# Patient Record
Sex: Female | Born: 1980 | Race: Black or African American | Hispanic: No | Marital: Single | Smoking: Never smoker
Health system: Southern US, Community
[De-identification: ages and names within clinical notes are randomized; demographics above are authoritative.]

## PROBLEM LIST (undated history)

## (undated) ENCOUNTER — Inpatient Hospital Stay (HOSPITAL_COMMUNITY): Payer: Self-pay

## (undated) DIAGNOSIS — D649 Anemia, unspecified: Secondary | ICD-10-CM

## (undated) HISTORY — PX: DILATION AND CURETTAGE OF UTERUS: SHX78

## (undated) HISTORY — DX: Anemia, unspecified: D64.9

---

## 2015-01-23 ENCOUNTER — Encounter (HOSPITAL_COMMUNITY): Payer: Self-pay | Admitting: Emergency Medicine

## 2015-01-23 ENCOUNTER — Emergency Department (HOSPITAL_COMMUNITY)
Admission: EM | Admit: 2015-01-23 | Discharge: 2015-01-23 | Disposition: A | Discharge status: Home / Self Care | Payer: Medicaid - Out of State | Attending: Emergency Medicine | Admitting: Emergency Medicine

## 2015-01-23 DIAGNOSIS — Z30432 Encounter for removal of intrauterine contraceptive device: Secondary | ICD-10-CM | POA: Diagnosis not present

## 2015-01-23 DIAGNOSIS — Z3046 Encounter for surveillance of implantable subdermal contraceptive: Secondary | ICD-10-CM

## 2015-01-23 MED ORDER — LIDOCAINE HCL (PF) 1 % IJ SOLN
5.0000 mL | Freq: Once | INTRAMUSCULAR | Status: DC
  Filled 2015-01-23: qty 5

## 2015-01-23 NOTE — ED Notes (Signed)
Pt sts here to have Nexplanon removed

## 2015-01-23 NOTE — Discharge Instructions (Signed)
Please monitor for signs of infection, pain present please return immediately. Please follow-up with [facility] and wellness for further evaluation and management of your health needs.

## 2015-01-23 NOTE — ED Provider Notes (Signed)
CSN: [id number]     Arrival date & time 01/23/15  1319 History  This chart was scribed for non-physician practitioner, Eyvonne Mechanic, PA-C, working with Derwood Kaplan, MD, by Ronney Lion, ED Scribe. This patient was seen in room TR04C/TR04C and the patient's care was started at 2:30 PM.    Chief Complaint  Patient presents with  . remove Nexplanon    The history is provided by the patient. No language interpreter was used.   HPI Comments: Erica Bryant is a 34 y.o. female who presents to the Emergency Department to have her Nexplanon removed because it is expired. Patient states her doctor in [place] put this in 3 years ago. However, she just moved to [facility] and does not have a PCP. She reports she is normally healthy, with no chronic medical conditions.  Patient has no other complaints.  History reviewed. No pertinent past medical history. History reviewed. No pertinent past surgical history. History reviewed. No pertinent family history. Social History  Substance Use Topics  . Smoking status: Never Smoker   . Smokeless tobacco: None  . Alcohol Use: Yes   OB History    No data available     Review of Systems  All other systems reviewed and are negative.   Allergies  Review of patient's allergies indicates no known allergies.  Home Medications   Prior to Admission medications   Not on File   BP 120/71 mmHg  Pulse 70  Temp(Src) 98.3 F (36.8 C) (Oral)  Resp 18  SpO2 99%   Physical Exam  Constitutional: She is oriented to person, place, and time. She appears well-developed and well-nourished. No distress.  HENT:  Head: Normocephalic and atraumatic.  Eyes: Conjunctivae and EOM are normal.  Neck: Neck supple. No tracheal deviation present.  Cardiovascular: Normal rate.   Pulmonary/Chest: Effort normal. No respiratory distress.  Musculoskeletal: Normal range of motion.  Nexplanon left upper arm  Neurological: She is alert and oriented to person, place,  and time.  Skin: Skin is warm and dry.  Psychiatric: She has a normal mood and affect. Her behavior is normal.  Nursing note and vitals reviewed.   ED Course  Procedures (including critical care time)  Nexplanon removal performed by myself. Risks and benefits were discussed with patient, who agreed and understood risks of removal. 0.25 cm incision was made after area was prepped using Betadine, And was removed with very minimal amount of bleeding, pressure applied bleeding controlled, Steri-Strips placed over the small incision site. Entirety of Nexplanon was visualized, patient tolerated the procedure well without complication. Local anesthesia achieved with 1% lidocaine, 25-gauge needle, 1 mL of lidocaine total.   Labs Review Labs Reviewed - No data to display  Imaging Review No results found. I have personally reviewed and evaluated these images and lab results as part of my medical decision-making.   EKG Interpretation None      MDM   Final diagnoses:  Nexplanon removal    Labs:   Imaging:  Consults:  Therapeutics: Lidocaine  Discharge Meds:   Assessment/Plan: Patient presents for removal of Nexplanon. No signs of infection, easily removed with minimal blood loss. No bleeding at the time of discharge, patient given strict return precautions, encouraged to monitor for signs of infection, return immediately. She was given follow-up information for [facility] wellness in the event new signs or symptoms presented. Patient is encouraged to use birth control at this time.   I personally performed the services described in this documentation, which  was scribed in my presence. The recorded information has been reviewed and is accurate.     Eyvonne Mechanic, PA-C 01/23/15 1640  Derwood Kaplan, MD 01/23/15 1651

## 2015-01-23 NOTE — ED Notes (Signed)
PT called X2 no answer

## 2015-05-06 NOTE — L&D Delivery Note (Signed)
Patient is 35 y.o. [id number] 1169w3d admitted SOL   Delivery Note At 1:41 AM a viable female was delivered via Vaginal, Spontaneous Delivery (Presentation: ROA ).  APGAR: 9, 9 ; weight pending. Placenta status: intact, .  Cord: 3 vessel  Without complications   Anesthesia:  epidural Episiotomy: None Lacerations: 1st degree perineal Suture Repair: 3.0 vicryl Est. Blood Loss (mL):  100  Mom to postpartum.  Baby to Couplet care / Skin to Skin.  Leland HerElsia J Yoo 01/06/2016, 2:17 AM   Upon arrival patient was complete and pushing. She pushed with good maternal effort to deliver a healthy baby girl. Baby delivered without difficulty, was noted to have good tone and place on maternal abdomen for oral suctioning, drying and stimulation. Delayed cord clamping performed. Placenta delivered intact with 3V cord. Vaginal canal and perineum was inspected and repaired; hemostatic. Pitocin was started and uterus massaged until bleeding slowed. Counts of sharps, instruments, and lap pads were all correct.   Leland HerElsia J Yoo, DO PGY-1 9/3/20172:17 AM    OB FELLOW DELIVERY ATTESTATION  I was gloved and present for the delivery in its entirety, and I agree with the above resident's note.    Jen MowElizabeth Rosine Solecki, DO OB Fellow 5:03 AM

## 2015-05-31 ENCOUNTER — Ambulatory Visit: Payer: Self-pay

## 2015-05-31 ENCOUNTER — Encounter: Payer: Self-pay | Admitting: Obstetrics & Gynecology

## 2015-06-01 LAB — POCT PREGNANCY, URINE: PREG TEST UR: POSITIVE — AB

## 2015-06-25 ENCOUNTER — Other Ambulatory Visit (HOSPITAL_COMMUNITY)
Admission: RE | Admit: 2015-06-25 | Discharge: 2015-06-25 | Disposition: A | Discharge status: Home / Self Care | Payer: Medicaid Other | Source: Ambulatory Visit | Attending: Family Medicine | Admitting: Family Medicine

## 2015-06-25 ENCOUNTER — Ambulatory Visit (INDEPENDENT_AMBULATORY_CARE_PROVIDER_SITE_OTHER): Payer: Medicaid Other | Admitting: Family Medicine

## 2015-06-25 ENCOUNTER — Encounter: Payer: Self-pay | Admitting: Family Medicine

## 2015-06-25 VITALS — BP 116/77 | HR 88 | Temp 98.7°F | Ht 66.0 in | Wt 158.0 lb

## 2015-06-25 DIAGNOSIS — Z1151 Encounter for screening for human papillomavirus (HPV): Secondary | ICD-10-CM | POA: Insufficient documentation

## 2015-06-25 DIAGNOSIS — Z3481 Encounter for supervision of other normal pregnancy, first trimester: Secondary | ICD-10-CM

## 2015-06-25 DIAGNOSIS — Z124 Encounter for screening for malignant neoplasm of cervix: Secondary | ICD-10-CM

## 2015-06-25 DIAGNOSIS — Z01419 Encounter for gynecological examination (general) (routine) without abnormal findings: Secondary | ICD-10-CM | POA: Diagnosis present

## 2015-06-25 DIAGNOSIS — Z3490 Encounter for supervision of normal pregnancy, unspecified, unspecified trimester: Secondary | ICD-10-CM | POA: Insufficient documentation

## 2015-06-25 DIAGNOSIS — Z113 Encounter for screening for infections with a predominantly sexual mode of transmission: Secondary | ICD-10-CM | POA: Diagnosis present

## 2015-06-25 DIAGNOSIS — Z3491 Encounter for supervision of normal pregnancy, unspecified, first trimester: Secondary | ICD-10-CM

## 2015-06-25 LAB — POCT URINALYSIS DIP (DEVICE)
Bilirubin Urine: NEGATIVE
GLUCOSE, UA: NEGATIVE mg/dL
KETONES UR: NEGATIVE mg/dL
LEUKOCYTES UA: NEGATIVE
Nitrite: NEGATIVE
Protein, ur: NEGATIVE mg/dL
Specific Gravity, Urine: 1.02 (ref 1.005–1.030)
Urobilinogen, UA: 0.2 mg/dL (ref 0.0–1.0)
pH: 6 (ref 5.0–8.0)

## 2015-06-25 NOTE — Patient Instructions (Addendum)
Safe Medications in Pregnancy   Acne: Benzoyl Peroxide Salicylic Acid  Backache/Headache: Tylenol: 2 regular strength every 4 hours OR              2 Extra strength every 6 hours  Colds/Coughs/Allergies: Benadryl (alcohol free) 25 mg every 6 hours as needed Breath right strips Claritin Cepacol throat lozenges Chloraseptic throat spray Cold-Eeze- up to three times per day Cough drops, alcohol free Flonase (by prescription only) Guaifenesin Mucinex Robitussin DM (plain only, alcohol free) Saline nasal spray/drops Sudafed (pseudoephedrine) & Actifed ** use only after [redacted] weeks gestation and if you do not have high blood pressure Tylenol Vicks Vaporub Zinc lozenges Zyrtec   Constipation: Colace Ducolax suppositories Fleet enema Glycerin suppositories Metamucil Milk of magnesia Miralax Senokot Smooth move tea  Diarrhea: Kaopectate Imodium A-D  *NO pepto Bismol  Hemorrhoids: Anusol Anusol HC Preparation H Tucks  Indigestion: Tums Maalox Mylanta Zantac  Pepcid  Insomnia: Benadryl (alcohol free) 25mg every 6 hours as needed Tylenol PM Unisom, no Gelcaps  Leg Cramps: Tums MagGel  Nausea/Vomiting:  Bonine Dramamine Emetrol Ginger extract Sea bands Meclizine  Nausea medication to take during pregnancy:  Unisom (doxylamine succinate 25 mg tablets) Take one tablet daily at bedtime. If symptoms are not adequately controlled, the dose can be increased to a maximum recommended dose of two tablets daily (1/2 tablet in the morning, 1/2 tablet mid-afternoon and one at bedtime). Vitamin B6 100mg tablets. Take one tablet twice a day (up to 200 mg per day).  Skin Rashes: Aveeno products Benadryl cream or 25mg every 6 hours as needed Calamine Lotion 1% cortisone cream  Yeast infection: Gyne-lotrimin 7 Monistat 7   **If taking multiple medications, please check labels to avoid duplicating the same active ingredients **take medication as directed on  the label ** Do not exceed 4000 mg of tylenol in 24 hours **Do not take medications that contain aspirin or ibuprofen    First Trimester of Pregnancy The first trimester of pregnancy is from week 1 until the end of week 12 (months 1 through 3). A week after a sperm fertilizes an egg, the egg will implant on the wall of the uterus. This embryo will begin to develop into a baby. Genes from you and your partner are forming the baby. The female genes determine whether the baby is a boy or a girl. At 6-8 weeks, the eyes and face are formed, and the heartbeat can be seen on ultrasound. At the end of 12 weeks, all the baby's organs are formed.  Now that you are pregnant, you will want to do everything you can to have a healthy baby. Two of the most important things are to get good prenatal care and to follow your health care provider's instructions. Prenatal care is all the medical care you receive before the baby's birth. This care will help prevent, find, and treat any problems during the pregnancy and childbirth. BODY CHANGES Your body goes through many changes during pregnancy. The changes vary from woman to woman.   You may gain or lose a couple of pounds at first.  You may feel sick to your stomach (nauseous) and throw up (vomit). If the vomiting is uncontrollable, call your health care provider.  You may tire easily.  You may develop headaches that can be relieved by medicines approved by your health care provider.  You may urinate more often. Painful urination may mean you have a bladder infection.  You may develop heartburn as a result of your   pregnancy.  You may develop constipation because certain hormones are causing the muscles that push waste through your intestines to slow down.  You may develop hemorrhoids or swollen, bulging veins (varicose veins).  Your breasts may begin to grow larger and become tender. Your nipples may stick out more, and the tissue that surrounds them (areola)  may become darker.  Your gums may bleed and may be sensitive to brushing and flossing.  Dark spots or blotches (chloasma, mask of pregnancy) may develop on your face. This will likely fade after the baby is born.  Your menstrual periods will stop.  You may have a loss of appetite.  You may develop cravings for certain kinds of food.  You may have changes in your emotions from day to day, such as being excited to be pregnant or being concerned that something may go wrong with the pregnancy and baby.  You may have more vivid and strange dreams.  You may have changes in your hair. These can include thickening of your hair, rapid growth, and changes in texture. Some women also have hair loss during or after pregnancy, or hair that feels dry or thin. Your hair will most likely return to normal after your baby is born. WHAT TO EXPECT AT YOUR PRENATAL VISITS During a routine prenatal visit:  You will be weighed to make sure you and the baby are growing normally.  Your blood pressure will be taken.  Your abdomen will be measured to track your baby's growth.  The fetal heartbeat will be listened to starting around week 10 or 12 of your pregnancy.  Test results from any previous visits will be discussed. Your health care provider may ask you:  How you are feeling.  If you are feeling the baby move.  If you have had any abnormal symptoms, such as leaking fluid, bleeding, severe headaches, or abdominal cramping.  If you are using any tobacco products, including cigarettes, chewing tobacco, and electronic cigarettes.  If you have any questions. Other tests that may be performed during your first trimester include:  Blood tests to find your blood type and to check for the presence of any previous infections. They will also be used to check for low iron levels (anemia) and Rh antibodies. Later in the pregnancy, blood tests for diabetes will be done along with other tests if problems  develop.  Urine tests to check for infections, diabetes, or protein in the urine.  An ultrasound to confirm the proper growth and development of the baby.  An amniocentesis to check for possible genetic problems.  Fetal screens for spina bifida and Down syndrome.  You may need other tests to make sure you and the baby are doing well.  HIV (human immunodeficiency virus) testing. Routine prenatal testing includes screening for HIV, unless you choose not to have this test. HOME CARE INSTRUCTIONS  Medicines  Follow your health care provider's instructions regarding medicine use. Specific medicines may be either safe or unsafe to take during pregnancy.  Take your prenatal vitamins as directed.  If you develop constipation, try taking a stool softener if your health care provider approves. Diet  Eat regular, well-balanced meals. Choose a variety of foods, such as meat or vegetable-based protein, fish, milk and low-fat dairy products, vegetables, fruits, and whole grain breads and cereals. Your health care provider will help you determine the amount of weight gain that is right for you.  Avoid raw meat and uncooked cheese. These carry germs that can cause   birth defects in the baby.  Eating four or five small meals rather than three large meals a day may help relieve nausea and vomiting. If you start to feel nauseous, eating a few soda crackers can be helpful. Drinking liquids between meals instead of during meals also seems to help nausea and vomiting.  If you develop constipation, eat more high-fiber foods, such as fresh vegetables or fruit and whole grains. Drink enough fluids to keep your urine clear or pale yellow. Activity and Exercise  Exercise only as directed by your health care provider. Exercising will help you:  Control your weight.  Stay in shape.  Be prepared for labor and delivery.  Experiencing pain or cramping in the lower abdomen or low back is a good sign that you  should stop exercising. Check with your health care provider before continuing normal exercises.  Try to avoid standing for long periods of time. Move your legs often if you must stand in one place for a long time.  Avoid heavy lifting.  Wear low-heeled shoes, and practice good posture.  You may continue to have sex unless your health care provider directs you otherwise. Relief of Pain or Discomfort  Wear a good support bra for breast tenderness.   Take warm sitz baths to soothe any pain or discomfort caused by hemorrhoids. Use hemorrhoid cream if your health care provider approves.   Rest with your legs elevated if you have leg cramps or low back pain.  If you develop varicose veins in your legs, wear support hose. Elevate your feet for 15 minutes, 3-4 times a day. Limit salt in your diet. Prenatal Care  Schedule your prenatal visits by the twelfth week of pregnancy. They are usually scheduled monthly at first, then more often in the last 2 months before delivery.  Write down your questions. Take them to your prenatal visits.  Keep all your prenatal visits as directed by your health care provider. Safety  Wear your seat belt at all times when driving.  Make a list of emergency phone numbers, including numbers for family, friends, the hospital, and police and fire departments. General Tips  Ask your health care provider for a referral to a local prenatal education class. Begin classes no later than at the beginning of month 6 of your pregnancy.  Ask for help if you have counseling or nutritional needs during pregnancy. Your health care provider can offer advice or refer you to specialists for help with various needs.  Do not use hot tubs, steam rooms, or saunas.  Do not douche or use tampons or scented sanitary pads.  Do not cross your legs for long periods of time.  Avoid cat litter boxes and soil used by cats. These carry germs that can cause birth defects in the baby  and possibly loss of the fetus by miscarriage or stillbirth.  Avoid all smoking, herbs, alcohol, and medicines not prescribed by your health care provider. Chemicals in these affect the formation and growth of the baby.  Do not use any tobacco products, including cigarettes, chewing tobacco, and electronic cigarettes. If you need help quitting, ask your health care provider. You may receive counseling support and other resources to help you quit.  Schedule a dentist appointment. At home, brush your teeth with a soft toothbrush and be gentle when you floss. SEEK MEDICAL CARE IF:   You have dizziness.  You have mild pelvic cramps, pelvic pressure, or nagging pain in the abdominal area.  You have persistent   nausea, vomiting, or diarrhea.  You have a bad smelling vaginal discharge.  You have pain with urination.  You notice increased swelling in your face, hands, legs, or ankles. SEEK IMMEDIATE MEDICAL CARE IF:   You have a fever.  You are leaking fluid from your vagina.  You have spotting or bleeding from your vagina.  You have severe abdominal cramping or pain.  You have rapid weight gain or loss.  You vomit blood or material that looks like coffee grounds.  You are exposed to German measles and have never had them.  You are exposed to fifth disease or chickenpox.  You develop a severe headache.  You have shortness of breath.  You have any kind of trauma, such as from a fall or a car accident.   This information is not intended to replace advice given to you by your health care provider. Make sure you discuss any questions you have with your health care provider.   Document Released: 04/15/2001 Document Revised: 05/12/2014 Document Reviewed: 03/01/2013 Elsevier Interactive Patient Education 2016 Elsevier Inc.  

## 2015-06-25 NOTE — Progress Notes (Signed)
Subjective:  Erica Bryant is a 35 y.o. [id number] at [redacted]w[redacted]d being seen today for ongoing prenatal care.  She is currently monitored for the following issues for this low-risk pregnancy and has Supervision of low-risk pregnancy on her problem list.  Patient reports backache.  Contractions: Not present. Vag. Bleeding: None.  Movement: Absent. Denies leaking of fluid.   The following portions of the patient's history were reviewed and updated as appropriate: allergies, current medications, past family history, past medical history, past social history, past surgical history and problem list. Problem list updated.  Objective:   Filed Vitals:   06/25/15 1320 06/25/15 1321  BP: 116/77   Pulse: 88   Temp: 98.7 F (37.1 C)   Height:   (1.676 m)  Weight: 158 lb (71.668 kg)     Fetal Status: Fetal Heart Rate (bpm): 152   Movement: Absent     General:  Alert, oriented and cooperative. Patient is in no acute distress.  Skin: Skin is warm and dry. No rash noted.   Cardiovascular: Normal heart rate noted  Respiratory: Normal respiratory effort, no problems with respiration noted  Abdomen: Soft, gravid, appropriate for gestational age. Pain/Pressure: Absent     Pelvic: Vag. Bleeding: None     Cervical exam deferred        Extremities: Normal range of motion.  Edema: None  Mental Status: Normal mood and affect. Normal behavior. Normal judgment and thought content.   Urinalysis:      Assessment and Plan:  Pregnancy: [id number] at [redacted]w[redacted]d  1. Supervision of normal pregnancy in first trimester - add CWH box and reviewed PNC model, discussed healthy eating and back pain management - [place] MFM OB COMP + 14 WK; Future - [place] MFM Fetal Nuchal Translucency; Future - Prenatal Profile - Hemoglobinopathy evaluation - Culture, OB Urine - Prescript Monitor Profile(19) - Cytology - PAP - GC/Chlamydia probe amp ([facility])not at [facility]  2. Back pain in pregnancy -discussed use of warm bath, heating pad,  belly band and yoga/stretching -recommended tylenol but reviewed safety of motrin in early pregnancy and restriction after 26 weeks. Patient voiced understanding.  Preterm labor symptoms and general obstetric precautions including but not limited to vaginal bleeding, contractions, leaking of fluid and fetal movement were reviewed in detail with the patient. Please refer to After Visit Summary for other counseling recommendations.  Return in about 4 weeks (around 07/23/2015) for Routine prenatal care.  Future Appointments Date Time Provider Department Center  06/27/2015 11:30 AM [facility] [place] 2 WH-US 203  07/24/2015 1:40 PM Hurshel Party, CNM [facility] WOC  08/08/2015 2:15 PM [facility] [place] 3 WH-US [address]    Federico Flake, [place]

## 2015-06-26 LAB — PRENATAL PROFILE (SOLSTAS)
Antibody Screen: NEGATIVE
Basophils Absolute: 0 10*3/uL (ref 0.0–0.1)
Basophils Relative: 0 % (ref 0–1)
EOS PCT: 1 % (ref 0–5)
Eosinophils Absolute: 0.1 10*3/uL (ref 0.0–0.7)
HCT: 33.1 % — ABNORMAL LOW (ref 36.0–46.0)
HEP B S AG: NEGATIVE
HIV: NONREACTIVE
Hemoglobin: 10.6 g/dL — ABNORMAL LOW (ref 12.0–15.0)
LYMPHS ABS: 1.7 10*3/uL (ref 0.7–4.0)
LYMPHS PCT: 19 % (ref 12–46)
MCH: 21.7 pg — ABNORMAL LOW (ref 26.0–34.0)
MCHC: 32 g/dL (ref 30.0–36.0)
MCV: 67.7 fL — AB (ref 78.0–100.0)
MONO ABS: 0.5 10*3/uL (ref 0.1–1.0)
Monocytes Relative: 6 % (ref 3–12)
NEUTROS ABS: 6.7 10*3/uL (ref 1.7–7.7)
Neutrophils Relative %: 74 % (ref 43–77)
PLATELETS: 272 10*3/uL (ref 150–400)
RBC: 4.89 MIL/uL (ref 3.87–5.11)
RDW: 22.8 % — AB (ref 11.5–15.5)
Rh Type: POSITIVE
Rubella: 9.22 Index — ABNORMAL HIGH (ref <0.90)
WBC: 9 10*3/uL (ref 4.0–10.5)

## 2015-06-26 LAB — GC/CHLAMYDIA PROBE AMP (~~LOC~~) NOT AT ARMC
CHLAMYDIA, DNA PROBE: NEGATIVE
NEISSERIA GONORRHEA: NEGATIVE

## 2015-06-27 ENCOUNTER — Ambulatory Visit (HOSPITAL_COMMUNITY)
Admission: RE | Admit: 2015-06-27 | Discharge: 2015-06-27 | Disposition: A | Discharge status: Home / Self Care | Payer: Medicaid Other | Source: Ambulatory Visit | Attending: Advanced Practice Midwife | Admitting: Advanced Practice Midwife

## 2015-06-27 ENCOUNTER — Ambulatory Visit (HOSPITAL_COMMUNITY)
Admission: RE | Admit: 2015-06-27 | Discharge: 2015-06-27 | Disposition: A | Discharge status: Home / Self Care | Payer: Medicaid Other | Source: Ambulatory Visit | Attending: Family Medicine | Admitting: Family Medicine

## 2015-06-27 ENCOUNTER — Other Ambulatory Visit: Payer: Self-pay | Admitting: General Practice

## 2015-06-27 ENCOUNTER — Encounter (HOSPITAL_COMMUNITY): Payer: Self-pay

## 2015-06-27 VITALS — BP 124/74 | HR 76 | Wt 159.8 lb

## 2015-06-27 DIAGNOSIS — Z3491 Encounter for supervision of normal pregnancy, unspecified, first trimester: Secondary | ICD-10-CM

## 2015-06-27 DIAGNOSIS — Z36 Encounter for antenatal screening of mother: Secondary | ICD-10-CM | POA: Insufficient documentation

## 2015-06-27 DIAGNOSIS — Z3A12 12 weeks gestation of pregnancy: Secondary | ICD-10-CM | POA: Insufficient documentation

## 2015-06-27 LAB — HEMOGLOBINOPATHY EVALUATION
HGB A2 QUANT: 3 % (ref 2.2–3.2)
Hemoglobin Other: 0 %
Hgb A: 61.7 % — ABNORMAL LOW (ref 96.8–97.8)
Hgb F Quant: 0 % (ref 0.0–2.0)
Hgb S Quant: 0 %

## 2015-06-27 LAB — CULTURE, OB URINE
COLONY COUNT: NO GROWTH
Organism ID, Bacteria: NO GROWTH

## 2015-06-28 LAB — CYTOLOGY - PAP

## 2015-06-29 ENCOUNTER — Encounter: Payer: Self-pay | Admitting: Family Medicine

## 2015-06-29 ENCOUNTER — Telehealth: Payer: Self-pay

## 2015-06-29 DIAGNOSIS — R87810 Cervical high risk human papillomavirus (HPV) DNA test positive: Secondary | ICD-10-CM | POA: Insufficient documentation

## 2015-06-29 NOTE — Telephone Encounter (Signed)
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  Called pt and LM to return call to the Clinics.

## 2015-06-30 ENCOUNTER — Encounter: Payer: Self-pay | Admitting: Family Medicine

## 2015-06-30 DIAGNOSIS — F1290 Cannabis use, unspecified, uncomplicated: Secondary | ICD-10-CM | POA: Insufficient documentation

## 2015-06-30 LAB — PRESCRIPTION MONITORING PROFILE (19 PANEL)
AMPHETAMINE/METH: NEGATIVE ng/mL
Barbiturate Screen, Urine: NEGATIVE ng/mL
Benzodiazepine Screen, Urine: NEGATIVE ng/mL
Buprenorphine, Urine: NEGATIVE ng/mL
CARISOPRODOL, URINE: NEGATIVE ng/mL
COCAINE METABOLITES: NEGATIVE ng/mL
Creatinine, Urine: 127.57 mg/dL (ref >20.0)
Fentanyl, Ur: NEGATIVE ng/mL
MDMA URINE: NEGATIVE ng/mL
MEPERIDINE UR: NEGATIVE ng/mL
METHADONE SCREEN, URINE: NEGATIVE ng/mL
Methaqualone: NEGATIVE ng/mL
Nitrites, Initial: NEGATIVE ug/mL
OXYCODONE SCRN UR: NEGATIVE ng/mL
Opiate Screen, Urine: NEGATIVE ng/mL
PHENCYCLIDINE, UR: NEGATIVE ng/mL
Propoxyphene: NEGATIVE ng/mL
TAPENTADOLUR: NEGATIVE ng/mL
Tramadol Scrn, Ur: NEGATIVE ng/mL
Zolpidem, Urine: NEGATIVE ng/mL
pH, Initial: 6 pH (ref 4.5–8.9)

## 2015-06-30 LAB — CANNABANOIDS (GC/LC/MS), URINE: THC-COOH (GC/LC/MS), ur confirm: 34 ng/mL — AB (ref <5)

## 2015-07-02 NOTE — Telephone Encounter (Signed)
Called patient and informed/explained to her pap results. Patient verbalized understanding & had no questions

## 2015-07-03 ENCOUNTER — Encounter: Payer: Self-pay | Admitting: *Deleted

## 2015-07-03 DIAGNOSIS — Z3490 Encounter for supervision of normal pregnancy, unspecified, unspecified trimester: Secondary | ICD-10-CM

## 2015-07-04 ENCOUNTER — Other Ambulatory Visit (HOSPITAL_COMMUNITY): Payer: Self-pay

## 2015-07-24 ENCOUNTER — Ambulatory Visit (INDEPENDENT_AMBULATORY_CARE_PROVIDER_SITE_OTHER): Payer: Medicaid Other | Admitting: Advanced Practice Midwife

## 2015-07-24 ENCOUNTER — Encounter: Payer: Self-pay | Admitting: Family Medicine

## 2015-07-24 VITALS — BP 109/73 | HR 67 | Temp 98.5°F | Wt 164.4 lb

## 2015-07-24 DIAGNOSIS — Z3490 Encounter for supervision of normal pregnancy, unspecified, unspecified trimester: Secondary | ICD-10-CM

## 2015-07-24 DIAGNOSIS — Z3492 Encounter for supervision of normal pregnancy, unspecified, second trimester: Secondary | ICD-10-CM

## 2015-07-24 DIAGNOSIS — M5431 Sciatica, right side: Secondary | ICD-10-CM

## 2015-07-24 DIAGNOSIS — O9989 Other specified diseases and conditions complicating pregnancy, childbirth and the puerperium: Secondary | ICD-10-CM

## 2015-07-24 LAB — POCT URINALYSIS DIP (DEVICE)
BILIRUBIN URINE: NEGATIVE
Glucose, UA: NEGATIVE mg/dL
HGB URINE DIPSTICK: NEGATIVE
Ketones, ur: NEGATIVE mg/dL
LEUKOCYTES UA: NEGATIVE
NITRITE: POSITIVE — AB
Protein, ur: NEGATIVE mg/dL
Specific Gravity, Urine: 1.02 (ref 1.005–1.030)
UROBILINOGEN UA: 0.2 mg/dL (ref 0.0–1.0)
pH: 6 (ref 5.0–8.0)

## 2015-07-24 MED ORDER — CYCLOBENZAPRINE HCL 10 MG PO TABS: 5.0000 mg | ORAL_TABLET | Freq: Three times a day (TID) | ORAL | Status: DC | PRN

## 2015-07-24 NOTE — Progress Notes (Signed)
Subjective:  Erica Bryant is a 35 y.o. [id number] at 10217w5d being seen today for ongoing prenatal care.  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  Patient reports sciatic nerve pain in right hip/leg, similar to previous pregnancies.  Contractions: Not present. Vag. Bleeding: None.  Movement: Present. Denies leaking of fluid.   The following portions of the patient's history were reviewed and updated as appropriate: allergies, current medications, past family history, past medical history, past social history, past surgical history and problem list. Problem list updated.  Objective:   Filed Vitals:   07/24/15 1433  BP: 109/73  Pulse: 67  Temp: 98.5 F (36.9 C)  Weight: 164 lb 6.4 oz (74.571 kg)    Fetal Status: Fetal Heart Rate (bpm): 140   Movement: Present     General:  Alert, oriented and cooperative. Patient is in no acute distress.  Skin: Skin is warm and dry. No rash noted.   Cardiovascular: Normal heart rate noted  Respiratory: Normal respiratory effort, no problems with respiration noted  Abdomen: Soft, gravid, appropriate for gestational age. Pain/Pressure: Present     Pelvic: Vag. Bleeding: None Vag D/C Character: White   Cervical exam deferred        Extremities: Normal range of motion.  Edema: None  Mental Status: Normal mood and affect. Normal behavior. Normal judgment and thought content.   Urinalysis:      Assessment and Plan:  Pregnancy: [id number] at 3417w5d  1. Sciatica of right side --Printed teaching about sciatica given. Recommend safe stretches/exercise in pregnancy including pregnancy yoga.   - cyclobenzaprine (FLEXERIL) 10 MG tablet; Take 0.5-1 tablets (5-10 mg total) by mouth 3 (three) times daily as needed for muscle spasms.  Dispense: 30 tablet; Refill: 1  2. Supervision of low-risk pregnancy,  unspecified trimester  - POCT urinalysis dip (device)  Preterm labor symptoms and general obstetric precautions including but not limited to vaginal bleeding, contractions, leaking of fluid and fetal movement were reviewed in detail with the patient. Please refer to After Visit Summary for other counseling recommendations.  Return in about 4 weeks (around 08/21/2015).   Hurshel PartyLisa A Leftwich-Kirby, CNM

## 2015-07-24 NOTE — Patient Instructions (Signed)

## 2015-07-24 NOTE — Progress Notes (Signed)
Pain- sciatic pain  Educated pt on Rooming In

## 2015-07-25 NOTE — Progress Notes (Signed)
Home Medicaid Form Completed  

## 2015-08-08 ENCOUNTER — Ambulatory Visit (HOSPITAL_COMMUNITY)
Admission: RE | Admit: 2015-08-08 | Discharge: 2015-08-08 | Disposition: A | Discharge status: Home / Self Care | Payer: Medicaid Other | Source: Ambulatory Visit | Attending: Family Medicine | Admitting: Family Medicine

## 2015-08-08 ENCOUNTER — Other Ambulatory Visit: Payer: Self-pay | Admitting: General Practice

## 2015-08-08 DIAGNOSIS — Z3A18 18 weeks gestation of pregnancy: Secondary | ICD-10-CM

## 2015-08-08 DIAGNOSIS — O3503X1 Maternal care for (suspected) central nervous system malformation or damage in fetus, choroid plexus cysts, fetus 1: Secondary | ICD-10-CM

## 2015-08-08 DIAGNOSIS — O350XX1 Maternal care for (suspected) central nervous system malformation in fetus, fetus 1: Secondary | ICD-10-CM

## 2015-08-08 DIAGNOSIS — Z3491 Encounter for supervision of normal pregnancy, unspecified, first trimester: Secondary | ICD-10-CM

## 2015-08-08 DIAGNOSIS — Z36 Encounter for antenatal screening of mother: Secondary | ICD-10-CM | POA: Insufficient documentation

## 2015-08-14 DIAGNOSIS — O350XX0 Maternal care for (suspected) central nervous system malformation in fetus, not applicable or unspecified: Secondary | ICD-10-CM | POA: Insufficient documentation

## 2015-08-14 DIAGNOSIS — O3503X0 Maternal care for (suspected) central nervous system malformation or damage in fetus, choroid plexus cysts, not applicable or unspecified: Secondary | ICD-10-CM | POA: Insufficient documentation

## 2015-08-22 ENCOUNTER — Ambulatory Visit (INDEPENDENT_AMBULATORY_CARE_PROVIDER_SITE_OTHER): Payer: Medicaid Other | Admitting: Obstetrics & Gynecology

## 2015-08-22 VITALS — BP 107/67 | HR 89 | Temp 98.4°F | Wt 178.4 lb

## 2015-08-22 DIAGNOSIS — Z3492 Encounter for supervision of normal pregnancy, unspecified, second trimester: Secondary | ICD-10-CM

## 2015-08-22 LAB — POCT URINALYSIS DIP (DEVICE)
Bilirubin Urine: NEGATIVE
GLUCOSE, UA: NEGATIVE mg/dL
HGB URINE DIPSTICK: NEGATIVE
KETONES UR: NEGATIVE mg/dL
Leukocytes, UA: NEGATIVE
Nitrite: NEGATIVE
PROTEIN: NEGATIVE mg/dL
SPECIFIC GRAVITY, URINE: 1.015 (ref 1.005–1.030)
UROBILINOGEN UA: 0.2 mg/dL (ref 0.0–1.0)
pH: 7 (ref 5.0–8.0)

## 2015-08-22 NOTE — Progress Notes (Signed)
C/o rash that started 2 nights ago- woke up with it.Rash is on left arm only- itchy, raised macular areas.

## 2015-08-22 NOTE — Progress Notes (Signed)
Subjective:  Erica Bryant is a 35 y.o. [id number] at 4188w6d being seen today for ongoing prenatal care.  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  Patient reports no complaints.  Contractions: Not present. Vag. Bleeding: None.  Movement: Present. Denies leaking of fluid.   The following portions of the patient's history were reviewed and updated as appropriate: allergies, current medications, past family history, past medical history, past social history, past surgical history and problem list. Problem list updated.  Objective:   Filed Vitals:   08/22/15 1427  BP: 107/67  Pulse: 89  Temp: 98.4 F (36.9 C)  Weight: 178 lb 6.4 oz (80.922 kg)    Fetal Status: Fetal Heart Rate (bpm): 140 Fundal Height: 20 cm Movement: Present     General:  Alert, oriented and cooperative. Patient is in no acute distress.  Skin: Skin is warm and dry. No rash noted.   Cardiovascular: Normal heart rate noted  Respiratory: Normal respiratory effort, no problems with respiration noted  Abdomen: Soft, gravid, appropriate for gestational age. Pain/Pressure: Present     Pelvic: Vag. Bleeding: None    Cervical exam deferred        Extremities: Normal range of motion.  Edema: None  Mental Status: Normal mood and affect. Normal behavior. Normal judgment and thought content.   Urinalysis:      Assessment and Plan:  Pregnancy: [id number] at 8388w6d  1. Supervision of low-risk pregnancy, second trimester Normal first trimester screen. Normal anatomy screen except for isolated CPC; patient declined further testing. - Alpha fetoprotein, maternal  Preterm labor symptoms and general obstetric precautions including but not limited to vaginal bleeding, contractions, leaking of fluid and fetal movement  were reviewed in detail with the patient. Please refer to After Visit Summary for other counseling recommendations.  Return in about 4 weeks (around 09/19/2015) for OB Visit.   Tereso NewcomerUgonna A Natayah Warmack, MD

## 2015-08-22 NOTE — Patient Instructions (Signed)
Return to clinic for any scheduled appointments or obstetric concerns, or go to [facility] for evaluation   AREA PEDIATRIC/FAMILY PRACTICE PHYSICIANS  [facility] [address] [place], [place] [address] Phone - [phone]   Fax - [phone]  [facility] [address] [place], [place]  [address] Phone - [phone]   Fax - [phone]  [facility] [address] [place], [place]  [address] Phone - [phone]   Fax - [phone]  [facility] [address] [place], [place]  [address] Phone - [phone]   Fax - [phone]  [facility] [address] [place], [place]  [address] Phone - [phone]   Fax - [phone]  [facility] [address] [place], [place]  [address] Phone - [phone]   Fax - [phone]  [facility] [address] [place], [place]  [address] Phone - [phone]   Fax - [phone]  [facility] [address] [place], [place]  [address] Phone - [phone]   Fax - [phone]  [facility] [address] [place], [place]  [address] Phone - [phone]   Fax - [phone] [facility] [address] [place], [place]  [address] Phone - [phone]   Fax - [phone]  [facility] [address] [place], [place]  [address] Phone - [phone]   Fax - [phone]  [facility] [address] [place], [place]  [address] Phone - [phone]   Fax - [phone]  [facility] [address] [place], [place]  [address] Phone - [phone]   Fax - [phone]  [facility]IL[facility] GOSR[facility] [address] [place], [place]  [address] Phone - [phone]  [facility] [address] [place], [place]  [address] Phone - [phone]   Fax - [phone]  [facility] [address] [place], [place]  [address] Phone - [phone]   Fax - [phone]  [facility] [address] [place], [place]  [address] Phone - [phone]   Fax - [phone]  [facility] [address] [place], [place]  [address] Phone - [phone]   Fax - [phone]   [facility] [address] [place], [place]  [address] Phone - [phone]   Fax - [phone]  [facility] [address] [place], [place]  [address] Phone - [phone]   Fax - [phone]  [facility] R[facility] [address] [place], [place]  [address] Phone - [phone]   Fax - [phone]  [facility] [address] [place], [place]  [address] Phone - [phone]   Fax - [phone]  [facility] [address] [place], [place]  [address] Phone - [phone]   Fax - [phone] [facility] [address] [place], [place]  [address] Phone - [phone]   Fax - [phone]  [facility] [address] [place], [place]  [address] Phone - [phone]   Fax - [phone]  [facility] [address],   [address] Phone - [phone]   Fax - [phone]    

## 2015-08-23 LAB — ALPHA FETOPROTEIN, MATERNAL
AFP: 58.9 ng/mL
Curr Gest Age: 20.9 weeks
MOM FOR AFP: 0.91
OPEN SPINA BIFIDA: NEGATIVE
Osb Risk: 1:36200 {titer}

## 2015-08-30 ENCOUNTER — Encounter: Payer: Self-pay | Admitting: Obstetrics & Gynecology

## 2015-09-21 ENCOUNTER — Encounter: Payer: Medicaid Other | Admitting: Advanced Practice Midwife

## 2015-10-02 ENCOUNTER — Ambulatory Visit (INDEPENDENT_AMBULATORY_CARE_PROVIDER_SITE_OTHER): Payer: Medicaid Other | Admitting: Advanced Practice Midwife

## 2015-10-02 VITALS — BP 111/71 | HR 83 | Wt 177.0 lb

## 2015-10-02 DIAGNOSIS — Z23 Encounter for immunization: Secondary | ICD-10-CM

## 2015-10-02 DIAGNOSIS — D649 Anemia, unspecified: Secondary | ICD-10-CM

## 2015-10-02 DIAGNOSIS — O99013 Anemia complicating pregnancy, third trimester: Secondary | ICD-10-CM

## 2015-10-02 DIAGNOSIS — Z3492 Encounter for supervision of normal pregnancy, unspecified, second trimester: Secondary | ICD-10-CM

## 2015-10-02 LAB — CBC
HCT: 29.9 % — ABNORMAL LOW (ref 35.0–45.0)
HEMOGLOBIN: 9.3 g/dL — AB (ref 11.7–15.5)
MCH: 21 pg — AB (ref 27.0–33.0)
MCHC: 31.1 g/dL — AB (ref 32.0–36.0)
MCV: 67.5 fL — ABNORMAL LOW (ref 80.0–100.0)
PLATELETS: 232 10*3/uL (ref 140–400)
RBC: 4.43 MIL/uL (ref 3.80–5.10)
RDW: 18.8 % — AB (ref 11.0–15.0)
WBC: 10.6 10*3/uL (ref 3.8–10.8)

## 2015-10-02 LAB — POCT URINALYSIS DIP (DEVICE)
Bilirubin Urine: NEGATIVE
Glucose, UA: NEGATIVE mg/dL
HGB URINE DIPSTICK: NEGATIVE
Ketones, ur: NEGATIVE mg/dL
NITRITE: NEGATIVE
PH: 6.5 (ref 5.0–8.0)
PROTEIN: NEGATIVE mg/dL
SPECIFIC GRAVITY, URINE: 1.015 (ref 1.005–1.030)
UROBILINOGEN UA: 0.2 mg/dL (ref 0.0–1.0)

## 2015-10-02 MED ORDER — DOCUSATE SODIUM 100 MG PO CAPS: 100.0000 mg | ORAL_CAPSULE | Freq: Two times a day (BID) | ORAL | Status: DC | PRN

## 2015-10-02 MED ORDER — FERROUS SULFATE 325 (65 FE) MG PO TABS: 325.0000 mg | ORAL_TABLET | Freq: Every day | ORAL | Status: DC

## 2015-10-02 MED ORDER — TETANUS-DIPHTH-ACELL PERTUSSIS 5-2.5-18.5 LF-MCG/0.5 IM SUSP: 0.5000 mL | Freq: Once | INTRAMUSCULAR | Status: DC

## 2015-10-02 NOTE — Progress Notes (Signed)
Reviewed tip of week with patient  

## 2015-10-02 NOTE — Patient Instructions (Signed)
Back Pain in Pregnancy Back pain during pregnancy is common. It happens in about half of all pregnancies. It is important for you and your baby that you remain active during your pregnancy.If you feel that back pain is not allowing you to remain active or sleep well, it is time to see your caregiver. Back pain may be caused by several factors related to changes during your pregnancy.Fortunately, unless you had trouble with your back before your pregnancy, the pain is likely to get better after you deliver. Low back pain usually occurs between the fifth and seventh months of pregnancy. It can, however, happen in the first couple months. Factors that increase the risk of back problems include:   Previous back problems.  Injury to your back.  Having twins or multiple births.  A chronic cough.  Stress.  Job-related repetitive motions.  Muscle or spinal disease in the back.  Family history of back problems, ruptured (herniated) discs, or osteoporosis.  Depression, anxiety, and panic attacks. CAUSES   When you are pregnant, your body produces a hormone called relaxin. This hormonemakes the ligaments connecting the low back and pubic bones more flexible. This flexibility allows the baby to be delivered more easily. When your ligaments are loose, your muscles need to work harder to support your back. Soreness in your back can come from tired muscles. Soreness can also come from back tissues that are irritated since they are receiving less support.  As the baby grows, it puts pressure on the nerves and blood vessels in your pelvis. This can cause back pain.  As the baby grows and gets heavier during pregnancy, the uterus pushes the stomach muscles forward and changes your center of gravity. This makes your back muscles work harder to maintain good posture. SYMPTOMS  Lumbar pain during pregnancy Lumbar pain during pregnancy usually occurs at or above the waist in the center of the back. There  may be pain and numbness that radiates into your leg or foot. This is similar to low back pain experienced by non-pregnant women. It usually increases with sitting for long periods of time, standing, or repetitive lifting. Tenderness may also be present in the muscles along your upper back. Posterior pelvic pain during pregnancy Pain in the back of the pelvis is more common than lumbar pain in pregnancy. It is a deep pain felt in your side at the waistline, or across the tailbone (sacrum), or in both places. You may have pain on one or both sides. This pain can also go into the buttocks and backs of the upper thighs. Pubic and groin pain may also be present. The pain does not quickly resolve with rest, and morning stiffness may also be present. Pelvic pain during pregnancy can be brought on by most activities. A high level of fitness before and during pregnancy may or may not prevent this problem. Labor pain is usually 1 to 2 minutes apart, lasts for about 1 minute, and involves a bearing down feeling or pressure in your pelvis. However, if you are at term with the pregnancy, constant low back pain can be the beginning of early labor, and you should be aware of this. DIAGNOSIS  X-rays of the back should not be done during the first 12 to 14 weeks of the pregnancy and only when absolutely necessary during the rest of the pregnancy. MRIs do not give off radiation and are safe during pregnancy. MRIs also should only be done when absolutely necessary. HOME CARE INSTRUCTIONS  Exercise   as directed by your caregiver. Exercise is the most effective way to prevent or manage back pain. If you have a back problem, it is especially important to avoid sports that require sudden body movements. Swimming and walking are great activities.  Do not stand in one place for long periods of time.  Do not wear high heels.  Sit in chairs with good posture. Use a pillow on your lower back if necessary. Make sure your head  rests over your shoulders and is not hanging forward.  Try sleeping on your side, preferably the left side, with a pillow or two between your legs. If you are sore after a night's rest, your bedmay betoo soft.Try placing a board between your mattress and box spring.  Listen to your body when lifting.If you are experiencing pain, ask for help or try bending yourknees more so you can use your leg muscles rather than your back muscles. Squat down when picking up something from the floor. Do not bend over.  Eat a healthy diet. Try to gain weight within your caregiver's recommendations.  Use heat or cold packs 3 to 4 times a day for 15 minutes to help with the pain.  Only take over-the-counter or prescription medicines for pain, discomfort, or fever as directed by your caregiver. Sudden (acute) back pain  Use bed rest for only the most extreme, acute episodes of back pain. Prolonged bed rest over 48 hours will aggravate your condition.  Ice is very effective for acute conditions.  Put ice in a plastic bag.  Place a towel between your skin and the bag.  Leave the ice on for 10 to 20 minutes every 2 hours, or as needed.  Using heat packs for 30 minutes prior to activities is also helpful. Continued back pain See your caregiver if you have continued problems. Your caregiver can help or refer you for appropriate physical therapy. With conditioning, most back problems can be avoided. Sometimes, a more serious issue may be the cause of back pain. You should be seen right away if new problems seem to be developing. Your caregiver may recommend:  A maternity girdle.  An elastic sling.  A back brace.  A massage therapist or acupuncture. SEEK MEDICAL CARE IF:   You are not able to do most of your daily activities, even when taking the pain medicine you were given.  You need a referral to a physical therapist or chiropractor.  You want to try acupuncture. SEEK IMMEDIATE MEDICAL CARE  IF:  You develop numbness, tingling, weakness, or problems with the use of your arms or legs.  You develop severe back pain that is no longer relieved with medicines.  You have a sudden change in bowel or bladder control.  You have increasing pain in other areas of the body.  You develop shortness of breath, dizziness, or fainting.  You develop nausea, vomiting, or sweating.  You have back pain which is similar to labor pains.  You have back pain along with your water breaking or vaginal bleeding.  You have back pain or numbness that travels down your leg.  Your back pain developed after you fell.  You develop pain on one side of your back. You may have a kidney stone.  You see blood in your urine. You may have a bladder infection or kidney stone.  You have back pain with blisters. You may have shingles. Back pain is fairly common during pregnancy but should not be accepted as just part of   the process. Back pain should always be treated as soon as possible. This will make your pregnancy as pleasant as possible.   This information is not intended to replace advice given to you by your health care provider. Make sure you discuss any questions you have with your health care provider.   Document Released: 07/30/2005 Document Revised: 07/14/2011 Document Reviewed: 09/10/2010 Elsevier Interactive Patient Education 2016 Elsevier Inc[employer]  Sciatica Sciatica is pain, weakness, numbness, or tingling along the path of the sciatic nerve. The nerve starts in the lower back and runs down the back of each leg. The nerve controls the muscles in the lower leg and in the back of the knee, while also providing sensation to the back of the thigh, lower leg, and the sole of your foot. Sciatica is a symptom of another medical condition. For instance, nerve damage or certain conditions, such as a herniated disk or bone spur on the spine, pinch or put pressure on the sciatic nerve. This causes the pain,  weakness, or other sensations normally associated with sciatica. Generally, sciatica only affects one side of the body. CAUSES   Herniated or slipped disc.  Degenerative disk disease.  A pain disorder involving the narrow muscle in the buttocks (piriformis syndrome).  Pelvic injury or fracture.  Pregnancy.  Tumor (rare). SYMPTOMS  Symptoms can vary from mild to very severe. The symptoms usually travel from the low back to the buttocks and down the back of the leg. Symptoms can include:  Mild tingling or dull aches in the lower back, leg, or hip.  Numbness in the back of the calf or sole of the foot.  Burning sensations in the lower back, leg, or hip.  Sharp pains in the lower back, leg, or hip.  Leg weakness.  Severe back pain inhibiting movement. These symptoms may get worse with coughing, sneezing, laughing, or prolonged sitting or standing. Also, being overweight may worsen symptoms. DIAGNOSIS  Your caregiver will perform a physical exam to look for common symptoms of sciatica. He or she may ask you to do certain movements or activities that would trigger sciatic nerve pain. Other tests may be performed to find the cause of the sciatica. These may include:  Blood tests.  X-rays.  Imaging tests, such as an MRI or CT scan. TREATMENT  Treatment is directed at the cause of the sciatic pain. Sometimes, treatment is not necessary and the pain and discomfort goes away on its own. If treatment is needed, your caregiver may suggest:  Over-the-counter medicines to relieve pain.  Prescription medicines, such as anti-inflammatory medicine, muscle relaxants, or narcotics.  Applying heat or ice to the painful area.  Steroid injections to lessen pain, irritation, and inflammation around the nerve.  Reducing activity during periods of pain.  Exercising and stretching to strengthen your abdomen and improve flexibility of your spine. Your caregiver may suggest losing weight if the  extra weight makes the back pain worse.  Physical therapy.  Surgery to eliminate what is pressing or pinching the nerve, such as a bone spur or part of a herniated disk. HOME CARE INSTRUCTIONS   Only take over-the-counter or prescription medicines for pain or discomfort as directed by your caregiver.  Apply ice to the affected area for 20 minutes, 3-4 times a day for the first 48-72 hours. Then try heat in the same way.  Exercise, stretch, or perform your usual activities if these do not aggravate your pain.  Attend physical therapy sessions as directed by your  caregiver.  Keep all follow-up appointments as directed by your caregiver.  Do not wear high heels or shoes that do not provide proper support.  Check your mattress to see if it is too soft. A firm mattress may lessen your pain and discomfort. SEEK IMMEDIATE MEDICAL CARE IF:   You lose control of your bowel or bladder (incontinence).  You have increasing weakness in the lower back, pelvis, buttocks, or legs.  You have redness or swelling of your back.  You have a burning sensation when you urinate.  You have pain that gets worse when you lie down or awakens you at night.  Your pain is worse than you have experienced in the past.  Your pain is lasting longer than 4 weeks.  You are suddenly losing weight without reason. MAKE SURE YOU:  Understand these instructions.  Will watch your condition.  Will get help right away if you are not doing well or get worse.   This information is not intended to replace advice given to you by your health care provider. Make sure you discuss any questions you have with your health care provider.   Document Released: 04/15/2001 Document Revised: 01/10/2015 Document Reviewed: 08/31/2011 Elsevier Interactive Patient Education [employer]

## 2015-10-02 NOTE — Progress Notes (Signed)
Subjective:  Erica Bryant is a 35 y.o. [phone] at 2989w5d being seen today for ongoing prenatal care.  She is currently monitored for the following issues for this low-risk pregnancy and has Supervision of low-risk pregnancy; Cervical high risk human papillomavirus (HPV) DNA test positive; Uses marijuana; and Fetal choroid plexus cysts affecting antepartum care of mother on her problem list.  Patient reports no complaints.  Contractions: Irritability. Vag. Bleeding: None.  Movement: Present. Denies leaking of fluid.   The following portions of the patient's history were reviewed and updated as appropriate: allergies, current medications, past family history, past medical history, past social history, past surgical history and problem list. Problem list updated.  Objective:   Filed Vitals:   10/02/15 1303  BP: 111/71  Pulse: 83  Weight: 177 lb (80.287 kg)    Fetal Status: Fetal Heart Rate (bpm): 153   Movement: Present     General:  Alert, oriented and cooperative. Patient is in no acute distress.  Skin: Skin is warm and dry. No rash noted.   Cardiovascular: Normal heart rate noted  Respiratory: Normal respiratory effort, no problems with respiration noted  Abdomen: Soft, gravid, appropriate for gestational age. Pain/Pressure: Present     Pelvic: Vag. Bleeding: None     Cervical exam deferred        Extremities: Normal range of motion.  Edema: None  Mental Status: Normal mood and affect. Normal behavior. Normal judgment and thought content.   Urinalysis:      Assessment and Plan:  Pregnancy: [id number] at 989w5d  1. Supervision of low-risk pregnancy, second trimester  - CBC - RPR - HIV antibody (with reflex) - Glucose Tolerance, 1 HR (50g) w/o Fasting - Tdap vaccine greater than or equal to 7yo IM; Standing - Tdap vaccine greater than or equal to 7yo IM  2. Anemia affecting pregnancy in third trimester, antepartum  - Hgb on 2/20 was 10.6 but pt [facility] daily fatigue [facility]. CBC today for 28 week labs.  Start iron tablet, 1 daily for now, will increase if needed based on lab results. - ferrous sulfate (FERROUSUL) 325 (65 FE) MG tablet; Take 1 tablet (325 mg total) by mouth daily with breakfast.  Dispense: 60 tablet; Refill: 1 -Colace 100 mg BID PRN, discussed preventing constipation by increasing PO fluids/fiber.  Preterm labor symptoms and general obstetric precautions including but not limited to vaginal bleeding, contractions, leaking of fluid and fetal movement were reviewed in detail with the patient. Please refer to After Visit Summary for other counseling recommendations.  Return in about 3 weeks (around 10/23/2015).   Hurshel PartyLisa A Leftwich-Kirby, CNM

## 2015-10-03 LAB — GLUCOSE TOLERANCE, 1 HOUR (50G) W/O FASTING: Glucose, 1 Hr, gestational: 119 mg/dL (ref <140)

## 2015-10-03 LAB — HIV ANTIBODY (ROUTINE TESTING W REFLEX): HIV: NONREACTIVE

## 2015-10-03 LAB — RPR

## 2015-10-07 ENCOUNTER — Other Ambulatory Visit: Payer: Self-pay | Admitting: Family

## 2015-10-07 ENCOUNTER — Encounter: Payer: Self-pay | Admitting: Family

## 2015-10-07 DIAGNOSIS — O99012 Anemia complicating pregnancy, second trimester: Secondary | ICD-10-CM

## 2015-10-07 DIAGNOSIS — O99019 Anemia complicating pregnancy, unspecified trimester: Secondary | ICD-10-CM | POA: Insufficient documentation

## 2015-10-23 ENCOUNTER — Ambulatory Visit (INDEPENDENT_AMBULATORY_CARE_PROVIDER_SITE_OTHER): Payer: Medicaid Other | Admitting: Family Medicine

## 2015-10-23 VITALS — BP 115/71 | HR 80 | Temp 98.6°F | Wt 177.7 lb

## 2015-10-23 DIAGNOSIS — O358XX1 Maternal care for other (suspected) fetal abnormality and damage, fetus 1: Secondary | ICD-10-CM

## 2015-10-23 DIAGNOSIS — O350XX1 Maternal care for (suspected) central nervous system malformation in fetus, fetus 1: Secondary | ICD-10-CM

## 2015-10-23 DIAGNOSIS — N898 Other specified noninflammatory disorders of vagina: Secondary | ICD-10-CM

## 2015-10-23 DIAGNOSIS — R87810 Cervical high risk human papillomavirus (HPV) DNA test positive: Secondary | ICD-10-CM | POA: Diagnosis not present

## 2015-10-23 DIAGNOSIS — Z3493 Encounter for supervision of normal pregnancy, unspecified, third trimester: Secondary | ICD-10-CM

## 2015-10-23 DIAGNOSIS — O99013 Anemia complicating pregnancy, third trimester: Secondary | ICD-10-CM

## 2015-10-23 DIAGNOSIS — N760 Acute vaginitis: Secondary | ICD-10-CM

## 2015-10-23 DIAGNOSIS — O3503X1 Maternal care for (suspected) central nervous system malformation or damage in fetus, choroid plexus cysts, fetus 1: Secondary | ICD-10-CM

## 2015-10-23 DIAGNOSIS — O99323 Drug use complicating pregnancy, third trimester: Secondary | ICD-10-CM | POA: Diagnosis not present

## 2015-10-23 DIAGNOSIS — O358XX0 Maternal care for other (suspected) fetal abnormality and damage, not applicable or unspecified: Secondary | ICD-10-CM | POA: Diagnosis not present

## 2015-10-23 DIAGNOSIS — F1290 Cannabis use, unspecified, uncomplicated: Secondary | ICD-10-CM

## 2015-10-23 DIAGNOSIS — O26893 Other specified pregnancy related conditions, third trimester: Secondary | ICD-10-CM | POA: Diagnosis not present

## 2015-10-23 DIAGNOSIS — A499 Bacterial infection, unspecified: Secondary | ICD-10-CM

## 2015-10-23 DIAGNOSIS — D649 Anemia, unspecified: Secondary | ICD-10-CM | POA: Diagnosis not present

## 2015-10-23 DIAGNOSIS — B9689 Other specified bacterial agents as the cause of diseases classified elsewhere: Secondary | ICD-10-CM

## 2015-10-23 LAB — POCT URINALYSIS DIP (DEVICE)
Bilirubin Urine: NEGATIVE
Glucose, UA: NEGATIVE mg/dL
Hgb urine dipstick: NEGATIVE
Ketones, ur: NEGATIVE mg/dL
Leukocytes, UA: NEGATIVE
Nitrite: NEGATIVE
Protein, ur: NEGATIVE mg/dL
Specific Gravity, Urine: 1.01 (ref 1.005–1.030)
Urobilinogen, UA: 0.2 mg/dL (ref 0.0–1.0)
pH: 6.5 (ref 5.0–8.0)

## 2015-10-23 NOTE — Progress Notes (Signed)
Subjective:  Erica Bryant is a 35 y.o. [phone] at 151w5d being seen today for ongoing prenatal care.  She is currently monitored for the following issues for this low-risk pregnancy and has Supervision of low-risk pregnancy; Cervical high risk human papillomavirus (HPV) DNA test positive; Uses marijuana; Fetal choroid plexus cysts affecting antepartum care of mother; and Anemia in pregnancy on her problem list.  Patient reports braxton hicks.  Contractions: Irregular. Vag. Bleeding: None.  Movement: Present. Denies leaking of fluid.   The following portions of the patient's history were reviewed and updated as appropriate: allergies, current medications, past family history, past medical history, past social history, past surgical history and problem list. Problem list updated.  Objective:   Filed Vitals:   10/23/15 1419  BP: 115/71  Pulse: 80  Temp: 98.6 F (37 C)  Weight: 177 lb 11.2 oz (80.604 kg)    Fetal Status: Fetal Heart Rate (bpm): 128 Fundal Height: 30 cm Movement: Present     General:  Alert, oriented and cooperative. Patient is in no acute distress.  Skin: Skin is warm and dry. No rash noted.   Cardiovascular: Normal heart rate noted  Respiratory: Normal respiratory effort, no problems with respiration noted  Abdomen: Soft, gravid, appropriate for gestational age. Pain/Pressure: Present     Pelvic: Cervical exam deferred        Extremities: Normal range of motion.  Edema: Trace  Mental Status: Normal mood and affect. Normal behavior. Normal judgment and thought content.   Urinalysis: Urine Protein: Negative Urine Glucose: Negative  Assessment and Plan:  Pregnancy: [id number] at 321w5d  1. Supervision of low-risk pregnancy, third trimester - updated box  2. Cervical high risk human papillomavirus (HPV) DNA test positive - needs regular  3. Anemia in pregnancy, third trimester  4. Uses marijuana  5. Fetal choroid plexus cysts affecting antepartum care of mother,  fetus 1  6. Vaginal discharge during pregnancy, third trimester - Wet prep, genital  Preterm labor symptoms and general obstetric precautions including but not limited to vaginal bleeding, contractions, leaking of fluid and fetal movement were reviewed in detail with the patient. Please refer to After Visit Summary for other counseling recommendations.  Return in about 2 weeks (around 11/06/2015) for Routine prenatal care.   Federico FlakeKimberly Niles Atalie Oros, MD

## 2015-10-23 NOTE — Progress Notes (Signed)
States has had mucousy discharge with vaginal itching.c/o braxton hicks about 4-5 times.

## 2015-10-23 NOTE — Patient Instructions (Signed)

## 2015-10-24 ENCOUNTER — Telehealth: Payer: Self-pay | Admitting: General Practice

## 2015-10-24 LAB — WET PREP, GENITAL
TRICH WET PREP: NONE SEEN
Yeast Wet Prep HPF POC: NONE SEEN

## 2015-10-24 MED ORDER — METRONIDAZOLE 500 MG PO TABS: 500.0000 mg | ORAL_TABLET | Freq: Two times a day (BID) | ORAL | Status: AC

## 2015-10-24 NOTE — Addendum Note (Signed)
Addended by: Geanie BerlinNEWTON, Nesta Scaturro N on: 10/24/2015 12:08 PM   Modules accepted: Orders

## 2015-10-24 NOTE — Telephone Encounter (Signed)
Patient has BV and flagyl has been sent to pharmacy. Called patient & informed her of results & medication sent to pharmacy. Patient verbalized understanding & had no questions

## 2015-11-15 ENCOUNTER — Encounter: Payer: Self-pay | Admitting: Obstetrics & Gynecology

## 2015-11-15 ENCOUNTER — Ambulatory Visit (INDEPENDENT_AMBULATORY_CARE_PROVIDER_SITE_OTHER): Payer: Medicaid Other | Admitting: Obstetrics & Gynecology

## 2015-11-15 VITALS — BP 113/81 | HR 82 | Wt 178.0 lb

## 2015-11-15 DIAGNOSIS — O350XX1 Maternal care for (suspected) central nervous system malformation in fetus, fetus 1: Secondary | ICD-10-CM

## 2015-11-15 DIAGNOSIS — D649 Anemia, unspecified: Secondary | ICD-10-CM

## 2015-11-15 DIAGNOSIS — R87810 Cervical high risk human papillomavirus (HPV) DNA test positive: Secondary | ICD-10-CM

## 2015-11-15 DIAGNOSIS — Z3493 Encounter for supervision of normal pregnancy, unspecified, third trimester: Secondary | ICD-10-CM

## 2015-11-15 DIAGNOSIS — O358XX0 Maternal care for other (suspected) fetal abnormality and damage, not applicable or unspecified: Secondary | ICD-10-CM

## 2015-11-15 DIAGNOSIS — O3503X1 Maternal care for (suspected) central nervous system malformation or damage in fetus, choroid plexus cysts, fetus 1: Secondary | ICD-10-CM

## 2015-11-15 DIAGNOSIS — O99013 Anemia complicating pregnancy, third trimester: Secondary | ICD-10-CM

## 2015-11-15 LAB — POCT URINALYSIS DIP (DEVICE)
BILIRUBIN URINE: NEGATIVE
GLUCOSE, UA: NEGATIVE mg/dL
Hgb urine dipstick: NEGATIVE
KETONES UR: NEGATIVE mg/dL
Nitrite: NEGATIVE
PH: 7 (ref 5.0–8.0)
Protein, ur: NEGATIVE mg/dL
Specific Gravity, Urine: 1.01 (ref 1.005–1.030)
Urobilinogen, UA: 0.2 mg/dL (ref 0.0–1.0)

## 2015-11-15 MED ORDER — INTEGRA F 125-1 MG PO CAPS: 1.0000 | ORAL_CAPSULE | Freq: Every day | ORAL | Status: DC

## 2015-11-15 NOTE — Patient Instructions (Addendum)
Contraception Choices Contraception (birth control) is the use of any methods or devices to prevent pregnancy. Below are some methods to help avoid pregnancy. HORMONAL METHODS   Contraceptive implant. This is a thin, plastic tube containing progesterone hormone. It does not contain estrogen hormone. Your health care provider inserts the tube in the inner part of the upper arm. The tube can remain in place for up to 3 years. After 3 years, the implant must be removed. The implant prevents the ovaries from releasing an egg (ovulation), thickens the cervical mucus to prevent sperm from entering the uterus, and thins the lining of the inside of the uterus.  Progesterone-only injections. These injections are given every 3 months by your health care provider to prevent pregnancy. This synthetic progesterone hormone stops the ovaries from releasing eggs. It also thickens cervical mucus and changes the uterine lining. This makes it harder for sperm to survive in the uterus.  Birth control pills. These pills contain estrogen and progesterone hormone. They work by preventing the ovaries from releasing eggs (ovulation). They also cause the cervical mucus to thicken, preventing the sperm from entering the uterus. Birth control pills are prescribed by a health care provider.Birth control pills can also be used to treat heavy periods.  Minipill. This type of birth control pill contains only the progesterone hormone. They are taken every day of each month and must be prescribed by your health care provider.  Birth control patch. The patch contains hormones similar to those in birth control pills. It must be changed once a week and is prescribed by a health care provider.  Vaginal ring. The ring contains hormones similar to those in birth control pills. It is left in the vagina for 3 weeks, removed for 1 week, and then a new one is put back in place. The patient must be comfortable inserting and removing the ring  from the vagina.A health care provider's prescription is necessary.  Emergency contraception. Emergency contraceptives prevent pregnancy after unprotected sexual intercourse. This pill can be taken right after sex or up to 5 days after unprotected sex. It is most effective the sooner you take the pills after having sexual intercourse. Most emergency contraceptive pills are available without a prescription. Check with your pharmacist. Do not use emergency contraception as your only form of birth control. BARRIER METHODS   Female condom. This is a thin sheath (latex or rubber) that is worn over the penis during sexual intercourse. It can be used with spermicide to increase effectiveness.  Female condom. This is a soft, loose-fitting sheath that is put into the vagina before sexual intercourse.  Diaphragm. This is a soft, latex, dome-shaped barrier that must be fitted by a health care provider. It is inserted into the vagina, along with a spermicidal jelly. It is inserted before intercourse. The diaphragm should be left in the vagina for 6 to 8 hours after intercourse.  Cervical cap. This is a round, soft, latex or plastic cup that fits over the cervix and must be fitted by a health care provider. The cap can be left in place for up to 48 hours after intercourse.  Sponge. This is a soft, circular piece of polyurethane foam. The sponge has spermicide in it. It is inserted into the vagina after wetting it and before sexual intercourse.  Spermicides. These are chemicals that kill or block sperm from entering the cervix and uterus. They come in the form of creams, jellies, suppositories, foam, or tablets. They do not require a   prescription. They are inserted into the vagina with an applicator before having sexual intercourse. The process must be repeated every time you have sexual intercourse. INTRAUTERINE CONTRACEPTION  Intrauterine device (IUD). This is a T-shaped device that is put in a woman's uterus  during a menstrual period to prevent pregnancy. There are 2 types:  Copper IUD. This type of IUD is wrapped in copper wire and is placed inside the uterus. Copper makes the uterus and fallopian tubes produce a fluid that kills sperm. It can stay in place for 10 years.  Hormone IUD. This type of IUD contains the hormone progestin (synthetic progesterone). The hormone thickens the cervical mucus and prevents sperm from entering the uterus, and it also thins the uterine lining to prevent implantation of a fertilized egg. The hormone can weaken or kill the sperm that get into the uterus. It can stay in place for 3-5 years, depending on which type of IUD is used. PERMANENT METHODS OF CONTRACEPTION  Female tubal ligation. This is when the woman's fallopian tubes are surgically sealed, tied, or blocked to prevent the egg from traveling to the uterus.  Hysteroscopic sterilization. This involves placing a small coil or insert into each fallopian tube. Your doctor uses a technique called hysteroscopy to do the procedure. The device causes scar tissue to form. This results in permanent blockage of the fallopian tubes, so the sperm cannot fertilize the egg. It takes about 3 months after the procedure for the tubes to become blocked. You must use another form of birth control for these 3 months.  Female sterilization. This is when the female has the tubes that carry sperm tied off (vasectomy).This blocks sperm from entering the vagina during sexual intercourse. After the procedure, the man can still ejaculate fluid (semen). NATURAL PLANNING METHODS  Natural family planning. This is not having sexual intercourse or using a barrier method (condom, diaphragm, cervical cap) on days the woman could become pregnant.  Calendar method. This is keeping track of the length of each menstrual cycle and identifying when you are fertile.  Ovulation method. This is avoiding sexual intercourse during ovulation.  Symptothermal  method. This is avoiding sexual intercourse during ovulation, using a thermometer and ovulation symptoms.  Post-ovulation method. This is timing sexual intercourse after you have ovulated. Regardless of which type or method of contraception you choose, it is important that you use condoms to protect against the transmission of sexually transmitted infections (STIs). Talk with your health care provider about which form of contraception is most appropriate for you.   This information is not intended to replace advice given to you by your health care provider. Make sure you discuss any questions you have with your health care provider.   Document Released: 04/21/2005 Document Revised: 04/26/2013 Document Reviewed: 10/14/2012 Elsevier Interactive Patient Education 2016 [employer] Melatonin oral solid dosage forms What is this medicine? MELATONIN (mel uh TOH nin) is a dietary supplement. It is mostly promoted to help maintain normal sleep patterns. The FDA has not approved this supplement for any medical use. This supplement may be used for other purposes; ask your health care provider or pharmacist if you have questions. This medicine may be used for other purposes; ask your health care provider or pharmacist if you have questions. What should I tell my health care provider before I take this medicine? They need to know if you have any of these conditions: -asthma -cancer -depression or mental illness -diabetes -hormone problems -if you often drink alcohol -immune  system problems -liver disease -organ transplant -seizure disorder -an unusual or allergic reaction to melatonin, other medicines, foods, dyes, or preservatives -pregnant or trying to get pregnant -breast-feeding How should I use this medicine? Take this supplement by mouth with a glass of water. Do not take with food. This supplement is usually taken 1 or 2 hours before bedtime. After taking this supplement, limit your  activities to those needed to prepare for bed. Some products may be chewed or dissolved in the mouth before swallowing. Some tablets or capsules must be swallowed whole; do not cut, crush or chew. Follow the directions on the package labeling, or take as directed by your health care professional. Do not take this supplement more often than directed. Talk to your pediatrician regarding the use of this supplement in children. Special care may be needed. This supplement is not recommended for use in children without a prescription. Overdosage: If you think you have taken too much of this medicine contact a poison control center or emergency room at once. NOTE: This medicine is only for you. Do not share this medicine with others. What if I miss a dose? If you miss taking your dose at the usual time, skip that dose. If it is almost time for your next dose, take only that dose. Do not take double or extra doses. What may interact with this medicine? Do not take this medicine with any of the following medications: -fluvoxamine -ramelteon -tasimelteon This medicine may also interact with the following medications: -alcohol -atazanavir -caffeine -carbamazepine -certain antibiotics like ciprofloxacin, enoxacin -certain medicines for depression, anxiety, or psychotic disturbances -cimetidine -female hormones, like estrogens and birth control pills, patches, rings, or injections -methoxsalen -nifedipine -other medications for sleep -other herbal or dietary supplements -phenobarbital -rifampin -smoking tobacco -tamoxifen -treatments for cancer, organ transplant, or immune disorders This list may not describe all possible interactions. Give your health care provider a list of all the medicines, herbs, non-prescription drugs, or dietary supplements you use. Also tell them if you smoke, drink alcohol, or use illegal drugs. Some items may interact with your medicine. What should I watch for while using  this medicine? See your doctor if your symptoms do not get better or if they get worse. Do not take this supplement for more than 2 weeks unless your doctor tells you to. You may get drowsy or dizzy. Do not drive, use machinery, or do anything that needs mental alertness until you know how this medicine affects you. Do not stand or sit up quickly, especially if you are an older patient. This reduces the risk of dizzy or fainting spells. Alcohol may interfere with the effect of this medicine. Avoid alcoholic drinks. Talk to your doctor before you use this supplement if you are currently being treated for an emotional, mental, or sleep problem. This medicine may interfere with your treatment. Herbal or dietary supplements are not regulated like medicines. Rigid quality control standards are not required for dietary supplements. The purity and strength of these products can vary. The safety and effect of this dietary supplement for a certain disease or illness is not well known. This product is not intended to diagnose, treat, cure or prevent any disease. The Food and Drug Administration suggests the following to help consumers protect themselves: -Always read product labels and follow directions. -Natural does not mean a product is safe for humans to take. -Look for products that include USP after the ingredient name. This means that the manufacturer followed the standards of  the [place] Pharmacopoeia. -Supplements made or sold by a nationally known food or drug company are more likely to be made under tight controls. You can write to the company for more information about how the product was made. What side effects may I notice from receiving this medicine? Side effects that you should report to your doctor or health care professional as soon as possible: -allergic reactions like skin rash, itching or hives, swelling of the face, lips, or tongue -breathing problems -change in sex drive or  performance -confusion -depressed mood, irritable, or other changes in moods or behaviors -fast, irregular heartbeat -feeling faint or lightheaded, falls -irregular or missed menstrual periods -leakage of milk from the nipples in a person who is not breast-feeding -signs and symptoms of liver injury like dark yellow or brown urine; general ill feeling or flu-like symptoms; light-colored stools; loss of appetite; nausea; right upper belly pain; unusually weak or tired; yellowing of the eyes or skin -sleep-walking -trouble staying awake or alert during the day -unusual activities while you are still asleep like driving, eating, making phone calls -unusual bleeding or bruising Side effects that usually do not require medical attention (report to your doctor or health care professional if they continue or are bothersome): -dizziness -drowsiness -headache -tiredness -unusual dreams or nightmares -upset stomach This list may not describe all possible side effects. Call your doctor for medical advice about side effects. You may report side effects to FDA at 1-800-FDA-1088. Where should I keep my medicine? Keep out of the reach of children. Store at room temperature or as directed on the package label. Protect from moisture. Throw away any unused supplement after the expiration date. NOTE: This sheet is a summary. It may not cover all possible information. If you have questions about this medicine, talk to your doctor, pharmacist, or health care provider.    2016, Elsevier/Gold Standard. (2015-01-03 09:44:59)

## 2015-11-15 NOTE — Progress Notes (Signed)
Subjective:  Erica Bryant is a 35 y.o. [id number] at 3835w0d being seen today for ongoing prenatal care.  She is currently monitored for the following issues for this low-risk pregnancy and has Supervision of low-risk pregnancy; Cervical high risk human papillomavirus (HPV) DNA test positive; Uses marijuana; Fetal choroid plexus cysts affecting antepartum care of mother; and Anemia in pregnancy on her problem list.  Patient reports no complaints.   .  .  Movement: Present. Denies leaking of fluid.   The following portions of the patient's history were reviewed and updated as appropriate: allergies, current medications, past family history, past medical history, past social history, past surgical history and problem list. Problem list updated.  Objective:   Filed Vitals:   11/15/15 1521  BP: 113/81  Pulse: 82  Weight: 178 lb (80.74 kg)    Fetal Status: Fetal Heart Rate (bpm): 147 Fundal Height: 36 cm Movement: Present     General:  Alert, oriented and cooperative. Patient is in no acute distress.  Skin: Skin is warm and dry. No rash noted.   Cardiovascular: Normal heart rate noted  Respiratory: Normal respiratory effort, no problems with respiration noted  Abdomen: Soft, gravid, appropriate for gestational age. Pain/Pressure: Present     Pelvic:  Cervical exam deferred        Extremities: Normal range of motion.     Mental Status: Normal mood and affect. Normal behavior. Normal judgment and thought content.   Urinalysis:      Assessment and Plan:  Pregnancy: [id number] at 4135w0d  1. Supervision of low-risk pregnancy, third trimester  2. Fetal choroid plexus cysts affecting antepartum care of mother, fetus 1 Pt declined further testing  3. Anemia in pregnancy, third trimester Pt not taking FeSO4 due to constipation  - Fe Fum-FePoly-FA-Vit C-Vit B3 (INTEGRA F) 125-1 MG CAPS; Take 1 capsule by mouth daily.  Dispense: 30 capsule; Refill: 3  4. Cervical high risk human papillomavirus  (HPV) DNA test positive  5. THC- use. Pt reports that she is taking it for insomnia.  Preterm labor symptoms and general obstetric precautions including but not limited to vaginal bleeding, contractions, leaking of fluid and fetal movement were reviewed in detail with the patient. Please refer to After Visit Summary for other counseling recommendations.  Return in about 2 weeks (around 11/29/2015).   Willodean Rosenthalarolyn Harraway-Smith, MD

## 2015-12-05 ENCOUNTER — Ambulatory Visit (INDEPENDENT_AMBULATORY_CARE_PROVIDER_SITE_OTHER): Payer: Medicaid Other | Admitting: Obstetrics and Gynecology

## 2015-12-05 ENCOUNTER — Other Ambulatory Visit (HOSPITAL_COMMUNITY)
Admission: RE | Admit: 2015-12-05 | Discharge: 2015-12-05 | Disposition: A | Discharge status: Home / Self Care | Payer: Medicaid Other | Source: Ambulatory Visit | Attending: Obstetrics and Gynecology | Admitting: Obstetrics and Gynecology

## 2015-12-05 VITALS — BP 128/77 | HR 97 | Wt 177.2 lb

## 2015-12-05 DIAGNOSIS — Z113 Encounter for screening for infections with a predominantly sexual mode of transmission: Secondary | ICD-10-CM | POA: Diagnosis not present

## 2015-12-05 DIAGNOSIS — O99013 Anemia complicating pregnancy, third trimester: Secondary | ICD-10-CM

## 2015-12-05 DIAGNOSIS — O358XX1 Maternal care for other (suspected) fetal abnormality and damage, fetus 1: Secondary | ICD-10-CM

## 2015-12-05 DIAGNOSIS — O3503X1 Maternal care for (suspected) central nervous system malformation or damage in fetus, choroid plexus cysts, fetus 1: Secondary | ICD-10-CM

## 2015-12-05 DIAGNOSIS — Z87898 Personal history of other specified conditions: Secondary | ICD-10-CM | POA: Diagnosis not present

## 2015-12-05 DIAGNOSIS — Z3493 Encounter for supervision of normal pregnancy, unspecified, third trimester: Secondary | ICD-10-CM

## 2015-12-05 DIAGNOSIS — F1290 Cannabis use, unspecified, uncomplicated: Secondary | ICD-10-CM | POA: Diagnosis not present

## 2015-12-05 DIAGNOSIS — F1291 Cannabis use, unspecified, in remission: Secondary | ICD-10-CM

## 2015-12-05 DIAGNOSIS — O350XX1 Maternal care for (suspected) central nervous system malformation in fetus, fetus 1: Secondary | ICD-10-CM

## 2015-12-05 LAB — OB RESULTS CONSOLE GBS: STREP GROUP B AG: POSITIVE

## 2015-12-05 LAB — POCT URINALYSIS DIP (DEVICE)
BILIRUBIN URINE: NEGATIVE
Glucose, UA: NEGATIVE mg/dL
Hgb urine dipstick: NEGATIVE
KETONES UR: NEGATIVE mg/dL
Leukocytes, UA: NEGATIVE
Nitrite: NEGATIVE
PH: 6 (ref 5.0–8.0)
PROTEIN: NEGATIVE mg/dL
Urobilinogen, UA: 0.2 mg/dL (ref 0.0–1.0)

## 2015-12-05 NOTE — Progress Notes (Signed)
Subjective:  Erica Bryant is a 35 y.o. [id number] at [redacted]w[redacted]d being seen today for ongoing prenatal care.  She is currently monitored for the following issues for this low-risk pregnancy and has Supervision of low-risk pregnancy; Cervical high risk human papillomavirus (HPV) DNA test positive; Uses marijuana; Fetal choroid plexus cysts affecting antepartum care of mother; and Anemia in pregnancy on her problem list.  Patient reports constipation which has been going on for weeks. She is not taking her iron or prenatal vitamin due to them causing worsening constipation. She is taking Colace once daily. .  Contractions: Irregular. Vag. Bleeding: None.  Movement: Present. Denies leaking of fluid.   The following portions of the patient's history were reviewed and updated as appropriate: allergies, current medications, past family history, past medical history, past social history, past surgical history and problem list. Problem list updated.  Objective:   Vitals:   12/05/15 1030  BP: 128/77  Pulse: 97  Weight: 177 lb 3.2 oz (80.4 kg)    Fetal Status: Fetal Heart Rate (bpm): 133   Movement: Present     General:  Alert, oriented and cooperative. Patient is in no acute distress.  Skin: Skin is warm and dry. No rash noted.   Cardiovascular: Normal heart rate noted  Respiratory: Normal respiratory effort, no problems with respiration noted  Abdomen: Soft, gravid, appropriate for gestational age. Pain/Pressure: Present     Pelvic:  Cervical exam performed        Extremities: Normal range of motion.  Edema: Trace  Mental Status: Normal mood and affect. Normal behavior. Normal judgment and thought content.   Urinalysis: Urine Protein: Negative Urine Glucose: Negative  Assessment and Plan:  Pregnancy: [id number] at [redacted]w[redacted]d  1. Supervision of low-risk pregnancy, third trimester  - GC/Chlamydia probe amp ([place])not at [facility] - Culture, beta strep (group b only) - Follow up in 1 week   2. Anemia  in pregnancy, third trimester  - Iron tablet daily - continue prenatal vitamins  - discussed management of constipation; colace or miralax. Continue colace BID   3. Fetal choroid plexus cysts affecting antepartum care of mother, fetus 1  - patient declined further testing   4. History of marijuana use  - Drug Screen, Urine today    Preterm labor symptoms and general obstetric precautions including but not limited to vaginal bleeding, contractions, leaking of fluid and fetal movement were reviewed in detail with the patient. Please refer to After Visit Summary for other counseling recommendations.  Return in about 1 week (around 12/12/2015).   Duane Lope, NP

## 2015-12-05 NOTE — Patient Instructions (Signed)

## 2015-12-05 NOTE — Addendum Note (Signed)
Addended by: Jill Side on: 12/05/2015 11:37 AM   Modules accepted: Orders

## 2015-12-06 LAB — GC/CHLAMYDIA PROBE AMP (~~LOC~~) NOT AT ARMC
Chlamydia: NEGATIVE
Neisseria Gonorrhea: NEGATIVE

## 2015-12-08 LAB — CULTURE, BETA STREP (GROUP B ONLY)

## 2015-12-10 ENCOUNTER — Encounter (HOSPITAL_COMMUNITY): Payer: Self-pay

## 2015-12-10 ENCOUNTER — Inpatient Hospital Stay (HOSPITAL_COMMUNITY)
Admission: AD | Admit: 2015-12-10 | Discharge: 2015-12-10 | Disposition: A | Discharge status: Home / Self Care | Payer: Medicaid Other | Source: Ambulatory Visit | Attending: Obstetrics & Gynecology | Admitting: Obstetrics & Gynecology

## 2015-12-10 ENCOUNTER — Encounter: Payer: Self-pay | Admitting: Obstetrics and Gynecology

## 2015-12-10 DIAGNOSIS — B951 Streptococcus, group B, as the cause of diseases classified elsewhere: Secondary | ICD-10-CM | POA: Insufficient documentation

## 2015-12-10 DIAGNOSIS — O350XX1 Maternal care for (suspected) central nervous system malformation in fetus, fetus 1: Secondary | ICD-10-CM

## 2015-12-10 DIAGNOSIS — O99013 Anemia complicating pregnancy, third trimester: Secondary | ICD-10-CM

## 2015-12-10 DIAGNOSIS — Z3493 Encounter for supervision of normal pregnancy, unspecified, third trimester: Secondary | ICD-10-CM

## 2015-12-10 DIAGNOSIS — O3503X1 Maternal care for (suspected) central nervous system malformation or damage in fetus, choroid plexus cysts, fetus 1: Secondary | ICD-10-CM

## 2015-12-10 DIAGNOSIS — Z3A36 36 weeks gestation of pregnancy: Secondary | ICD-10-CM | POA: Insufficient documentation

## 2015-12-10 LAB — PAIN MGMT, PROFILE 6 CONF W/O MM, U
6 ACETYLMORPHINE: NEGATIVE ng/mL (ref <10)
AMPHETAMINES: NEGATIVE ng/mL (ref <500)
Alcohol Metabolites: NEGATIVE ng/mL (ref <500)
Barbiturates: NEGATIVE ng/mL (ref <300)
Benzodiazepines: NEGATIVE ng/mL (ref <100)
COCAINE METABOLITE: NEGATIVE ng/mL (ref <150)
CREATININE: 46 mg/dL (ref >=20.0)
Marijuana Metabolite: 25 ng/mL — ABNORMAL HIGH (ref <5)
Marijuana Metabolite: POSITIVE ng/mL — AB (ref <20)
Methadone Metabolite: NEGATIVE ng/mL (ref <100)
Opiates: NEGATIVE ng/mL (ref <100)
Oxidant: NEGATIVE ug/mL (ref <200)
Oxycodone: NEGATIVE ng/mL (ref <100)
PH: 6.98 (ref 4.5–9.0)
Phencyclidine: NEGATIVE ng/mL (ref <25)
Please note:: 0

## 2015-12-10 NOTE — Discharge Instructions (Signed)
Braxton Hicks Contractions °Contractions of the uterus can occur throughout pregnancy. Contractions are not always a sign that you are in labor.  °WHAT ARE BRAXTON HICKS CONTRACTIONS?  °Contractions that occur before labor are called Braxton Hicks contractions, or false labor. Toward the end of pregnancy (32-34 weeks), these contractions can develop more often and may become more forceful. This is not true labor because these contractions do not result in opening (dilatation) and thinning of the cervix. They are sometimes difficult to tell apart from true labor because these contractions can be forceful and people have different pain tolerances. You should not feel embarrassed if you go to the hospital with false labor. Sometimes, the only way to tell if you are in true labor is for your health care provider to look for changes in the cervix. °If there are no prenatal problems or other health problems associated with the pregnancy, it is completely safe to be sent home with false labor and await the onset of true labor. °HOW CAN YOU TELL THE DIFFERENCE BETWEEN TRUE AND FALSE LABOR? °False Labor °· The contractions of false labor are usually shorter and not as hard as those of true labor.   °· The contractions are usually irregular.   °· The contractions are often felt in the front of the lower abdomen and in the groin.   °· The contractions may go away when you walk around or change positions while lying down.   °· The contractions get weaker and are shorter lasting as time goes on.   °· The contractions do not usually become progressively stronger, regular, and closer together as with true labor.   °True Labor °· Contractions in true labor last 30-70 seconds, become very regular, usually become more intense, and increase in frequency.   °· The contractions do not go away with walking.   °· The discomfort is usually felt in the top of the uterus and spreads to the lower abdomen and low back.   °· True labor can be  determined by your health care provider with an exam. This will show that the cervix is dilating and getting thinner.   °WHAT TO REMEMBER °· Keep up with your usual exercises and follow other instructions given by your health care provider.   °· Take medicines as directed by your health care provider.   °· Keep your regular prenatal appointments.   °· Eat and drink lightly if you think you are going into labor.   °· If Braxton Hicks contractions are making you uncomfortable:   °¨ Change your position from lying down or resting to walking, or from walking to resting.   °¨ Sit and rest in a tub of warm water.   °¨ Drink 2-3 glasses of water. Dehydration may cause these contractions.   °¨ Do slow and deep breathing several times an hour.   °WHEN SHOULD I SEEK IMMEDIATE MEDICAL CARE? °Seek immediate medical care if: °· Your contractions become stronger, more regular, and closer together.   °· You have fluid leaking or gushing from your vagina.   °· You have a fever.   °· You pass blood-tinged mucus.   °· You have vaginal bleeding.   °· You have continuous abdominal pain.   °· You have low back pain that you never had before.   °· You feel your baby's head pushing down and causing pelvic pressure.   °· Your baby is not moving as much as it used to.   °  °This information is not intended to replace advice given to you by your health care provider. Make sure you discuss any questions you have with your health care   provider. °  °Document Released: 04/21/2005 Document Revised: 04/26/2013 Document Reviewed: 01/31/2013 °Elsevier Interactive Patient Education ©2016 [employer] °Fetal Movement Counts °Patient Name: __________________________________________________ Patient Due Date: ____________________ °Performing a fetal movement count is highly recommended in high-risk pregnancies, but it is good for every pregnant woman to do. Your health care provider may ask you to start counting fetal movements at 28 weeks of the  pregnancy. Fetal movements often increase: °· After eating a full meal. °· After physical activity. °· After eating or drinking something sweet or cold. °· At rest. °Pay attention to when you feel the baby is most active. This will help you notice a pattern of your baby's sleep and wake cycles and what factors contribute to an increase in fetal movement. It is important to perform a fetal movement count at the same time each day when your baby is normally most active.  °HOW TO COUNT FETAL MOVEMENTS °1. Find a quiet and comfortable area to sit or lie down on your left side. Lying on your left side provides the best blood and oxygen circulation to your baby. °2. Write down the day and time on a sheet of paper or in a journal. °3. Start counting kicks, flutters, swishes, rolls, or jabs in a 2-hour period. You should feel at least 10 movements within 2 hours. °4. If you do not feel 10 movements in 2 hours, wait 2-3 hours and count again. Look for a change in the pattern or not enough counts in 2 hours. °SEEK MEDICAL CARE IF: °· You feel less than 10 counts in 2 hours, tried twice. °· There is no movement in over an hour. °· The pattern is changing or taking longer each day to reach 10 counts in 2 hours. °· You feel the baby is not moving as he or she usually does. °Date: ____________ Movements: ____________ Start time: ____________ Finish time: ____________  °Date: ____________ Movements: ____________ Start time: ____________ Finish time: ____________ °Date: ____________ Movements: ____________ Start time: ____________ Finish time: ____________ °Date: ____________ Movements: ____________ Start time: ____________ Finish time: ____________ °Date: ____________ Movements: ____________ Start time: ____________ Finish time: ____________ °Date: ____________ Movements: ____________ Start time: ____________ Finish time: ____________ °Date: ____________ Movements: ____________ Start time: ____________ Finish time:  ____________ °Date: ____________ Movements: ____________ Start time: ____________ Finish time: ____________  °Date: ____________ Movements: ____________ Start time: ____________ Finish time: ____________ °Date: ____________ Movements: ____________ Start time: ____________ Finish time: ____________ °Date: ____________ Movements: ____________ Start time: ____________ Finish time: ____________ °Date: ____________ Movements: ____________ Start time: ____________ Finish time: ____________ °Date: ____________ Movements: ____________ Start time: ____________ Finish time: ____________ °Date: ____________ Movements: ____________ Start time: ____________ Finish time: ____________ °Date: ____________ Movements: ____________ Start time: ____________ Finish time: ____________  °Date: ____________ Movements: ____________ Start time: ____________ Finish time: ____________ °Date: ____________ Movements: ____________ Start time: ____________ Finish time: ____________ °Date: ____________ Movements: ____________ Start time: ____________ Finish time: ____________ °Date: ____________ Movements: ____________ Start time: ____________ Finish time: ____________ °Date: ____________ Movements: ____________ Start time: ____________ Finish time: ____________ °Date: ____________ Movements: ____________ Start time: ____________ Finish time: ____________ °Date: ____________ Movements: ____________ Start time: ____________ Finish time: ____________  °Date: ____________ Movements: ____________ Start time: ____________ Finish time: ____________ °Date: ____________ Movements: ____________ Start time: ____________ Finish time: ____________ °Date: ____________ Movements: ____________ Start time: ____________ Finish time: ____________ °Date: ____________ Movements: ____________ Start time: ____________ Finish time: ____________ °Date: ____________ Movements: ____________ Start time: ____________ Finish time: ____________ °Date: ____________ Movements:  ____________ Start time: ____________ Finish time:   ____________ °Date: ____________ Movements: ____________ Start time: ____________ Finish time: ____________  °Date: ____________ Movements: ____________ Start time: ____________ Finish time: ____________ °Date: ____________ Movements: ____________ Start time: ____________ Finish time: ____________ °Date: ____________ Movements: ____________ Start time: ____________ Finish time: ____________ °Date: ____________ Movements: ____________ Start time: ____________ Finish time: ____________ °Date: ____________ Movements: ____________ Start time: ____________ Finish time: ____________ °Date: ____________ Movements: ____________ Start time: ____________ Finish time: ____________ °Date: ____________ Movements: ____________ Start time: ____________ Finish time: ____________  °Date: ____________ Movements: ____________ Start time: ____________ Finish time: ____________ °Date: ____________ Movements: ____________ Start time: ____________ Finish time: ____________ °Date: ____________ Movements: ____________ Start time: ____________ Finish time: ____________ °Date: ____________ Movements: ____________ Start time: ____________ Finish time: ____________ °Date: ____________ Movements: ____________ Start time: ____________ Finish time: ____________ °Date: ____________ Movements: ____________ Start time: ____________ Finish time: ____________ °Date: ____________ Movements: ____________ Start time: ____________ Finish time: ____________  °Date: ____________ Movements: ____________ Start time: ____________ Finish time: ____________ °Date: ____________ Movements: ____________ Start time: ____________ Finish time: ____________ °Date: ____________ Movements: ____________ Start time: ____________ Finish time: ____________ °Date: ____________ Movements: ____________ Start time: ____________ Finish time: ____________ °Date: ____________ Movements: ____________ Start time: ____________ Finish  time: ____________ °Date: ____________ Movements: ____________ Start time: ____________ Finish time: ____________ °Date: ____________ Movements: ____________ Start time: ____________ Finish time: ____________  °Date: ____________ Movements: ____________ Start time: ____________ Finish time: ____________ °Date: ____________ Movements: ____________ Start time: ____________ Finish time: ____________ °Date: ____________ Movements: ____________ Start time: ____________ Finish time: ____________ °Date: ____________ Movements: ____________ Start time: ____________ Finish time: ____________ °Date: ____________ Movements: ____________ Start time: ____________ Finish time: ____________ °Date: ____________ Movements: ____________ Start time: ____________ Finish time: ____________ °  °This information is not intended to replace advice given to you by your health care provider. Make sure you discuss any questions you have with your health care provider. °  °Document Released: 05/21/2006 Document Revised: 05/12/2014 Document Reviewed: 02/16/2012 °Elsevier Interactive Patient Education ©2016 [employer] Inc[employer] ° °

## 2015-12-10 NOTE — MAU Note (Signed)
Notified provider that patient is here for a labor eval. Patient was 2/50/-2 an hour ago and is unchanged. No bleeding or LOF. Fetus reactive. Provider said to discharge patient with labor precautions.

## 2015-12-19 ENCOUNTER — Inpatient Hospital Stay (HOSPITAL_COMMUNITY)
Admission: AD | Admit: 2015-12-19 | Discharge: 2015-12-19 | Disposition: A | Discharge status: Home / Self Care | Payer: Medicaid Other | Source: Ambulatory Visit | Attending: Obstetrics & Gynecology | Admitting: Obstetrics & Gynecology

## 2015-12-19 ENCOUNTER — Encounter (HOSPITAL_COMMUNITY): Payer: Self-pay

## 2015-12-19 DIAGNOSIS — Z3A00 Weeks of gestation of pregnancy not specified: Secondary | ICD-10-CM | POA: Diagnosis not present

## 2015-12-19 LAB — URINALYSIS, ROUTINE W REFLEX MICROSCOPIC
Bilirubin Urine: NEGATIVE
Glucose, UA: NEGATIVE mg/dL
Hgb urine dipstick: NEGATIVE
KETONES UR: 15 mg/dL — AB
LEUKOCYTES UA: NEGATIVE
NITRITE: NEGATIVE
PH: 6 (ref 5.0–8.0)
PROTEIN: NEGATIVE mg/dL
Specific Gravity, Urine: 1.01 (ref 1.005–1.030)

## 2015-12-19 NOTE — MAU Note (Signed)
Pt reports contractions off/on for a weeks, pain in pelvic area. Denies bleeding.

## 2015-12-19 NOTE — Discharge Instructions (Signed)
Braxton Hicks Contractions °Contractions of the uterus can occur throughout pregnancy. Contractions are not always a sign that you are in labor.  °WHAT ARE BRAXTON HICKS CONTRACTIONS?  °Contractions that occur before labor are called Braxton Hicks contractions, or false labor. Toward the end of pregnancy (32-34 weeks), these contractions can develop more often and may become more forceful. This is not true labor because these contractions do not result in opening (dilatation) and thinning of the cervix. They are sometimes difficult to tell apart from true labor because these contractions can be forceful and people have different pain tolerances. You should not feel embarrassed if you go to the hospital with false labor. Sometimes, the only way to tell if you are in true labor is for your health care provider to look for changes in the cervix. °If there are no prenatal problems or other health problems associated with the pregnancy, it is completely safe to be sent home with false labor and await the onset of true labor. °HOW CAN YOU TELL THE DIFFERENCE BETWEEN TRUE AND FALSE LABOR? °False Labor °· The contractions of false labor are usually shorter and not as hard as those of true labor.   °· The contractions are usually irregular.   °· The contractions are often felt in the front of the lower abdomen and in the groin.   °· The contractions may go away when you walk around or change positions while lying down.   °· The contractions get weaker and are shorter lasting as time goes on.   °· The contractions do not usually become progressively stronger, regular, and closer together as with true labor.   °True Labor °· Contractions in true labor last 30-70 seconds, become very regular, usually become more intense, and increase in frequency.   °· The contractions do not go away with walking.   °· The discomfort is usually felt in the top of the uterus and spreads to the lower abdomen and low back.   °· True labor can be  determined by your health care provider with an exam. This will show that the cervix is dilating and getting thinner.   °WHAT TO REMEMBER °· Keep up with your usual exercises and follow other instructions given by your health care provider.   °· Take medicines as directed by your health care provider.   °· Keep your regular prenatal appointments.   °· Eat and drink lightly if you think you are going into labor.   °· If Braxton Hicks contractions are making you uncomfortable:   °¨ Change your position from lying down or resting to walking, or from walking to resting.   °¨ Sit and rest in a tub of warm water.   °¨ Drink 2-3 glasses of water. Dehydration may cause these contractions.   °¨ Do slow and deep breathing several times an hour.   °WHEN SHOULD I SEEK IMMEDIATE MEDICAL CARE? °Seek immediate medical care if: °· Your contractions become stronger, more regular, and closer together.   °· You have fluid leaking or gushing from your vagina.   °· You have a fever.   °· You pass blood-tinged mucus.   °· You have vaginal bleeding.   °· You have continuous abdominal pain.   °· You have low back pain that you never had before.   °· You feel your baby's head pushing down and causing pelvic pressure.   °· Your baby is not moving as much as it used to.   °  °This information is not intended to replace advice given to you by your health care provider. Make sure you discuss any questions you have with your health care   provider. °  °Document Released: 04/21/2005 Document Revised: 04/26/2013 Document Reviewed: 01/31/2013 °Elsevier Interactive Patient Education ©2016 [employer] °Fetal Movement Counts °Patient Name: __________________________________________________ Patient Due Date: ____________________ °Performing a fetal movement count is highly recommended in high-risk pregnancies, but it is good for every pregnant woman to do. Your health care provider may ask you to start counting fetal movements at 28 weeks of the  pregnancy. Fetal movements often increase: °· After eating a full meal. °· After physical activity. °· After eating or drinking something sweet or cold. °· At rest. °Pay attention to when you feel the baby is most active. This will help you notice a pattern of your baby's sleep and wake cycles and what factors contribute to an increase in fetal movement. It is important to perform a fetal movement count at the same time each day when your baby is normally most active.  °HOW TO COUNT FETAL MOVEMENTS °1. Find a quiet and comfortable area to sit or lie down on your left side. Lying on your left side provides the best blood and oxygen circulation to your baby. °2. Write down the day and time on a sheet of paper or in a journal. °3. Start counting kicks, flutters, swishes, rolls, or jabs in a 2-hour period. You should feel at least 10 movements within 2 hours. °4. If you do not feel 10 movements in 2 hours, wait 2-3 hours and count again. Look for a change in the pattern or not enough counts in 2 hours. °SEEK MEDICAL CARE IF: °· You feel less than 10 counts in 2 hours, tried twice. °· There is no movement in over an hour. °· The pattern is changing or taking longer each day to reach 10 counts in 2 hours. °· You feel the baby is not moving as he or she usually does. °Date: ____________ Movements: ____________ Start time: ____________ Finish time: ____________  °Date: ____________ Movements: ____________ Start time: ____________ Finish time: ____________ °Date: ____________ Movements: ____________ Start time: ____________ Finish time: ____________ °Date: ____________ Movements: ____________ Start time: ____________ Finish time: ____________ °Date: ____________ Movements: ____________ Start time: ____________ Finish time: ____________ °Date: ____________ Movements: ____________ Start time: ____________ Finish time: ____________ °Date: ____________ Movements: ____________ Start time: ____________ Finish time:  ____________ °Date: ____________ Movements: ____________ Start time: ____________ Finish time: ____________  °Date: ____________ Movements: ____________ Start time: ____________ Finish time: ____________ °Date: ____________ Movements: ____________ Start time: ____________ Finish time: ____________ °Date: ____________ Movements: ____________ Start time: ____________ Finish time: ____________ °Date: ____________ Movements: ____________ Start time: ____________ Finish time: ____________ °Date: ____________ Movements: ____________ Start time: ____________ Finish time: ____________ °Date: ____________ Movements: ____________ Start time: ____________ Finish time: ____________ °Date: ____________ Movements: ____________ Start time: ____________ Finish time: ____________  °Date: ____________ Movements: ____________ Start time: ____________ Finish time: ____________ °Date: ____________ Movements: ____________ Start time: ____________ Finish time: ____________ °Date: ____________ Movements: ____________ Start time: ____________ Finish time: ____________ °Date: ____________ Movements: ____________ Start time: ____________ Finish time: ____________ °Date: ____________ Movements: ____________ Start time: ____________ Finish time: ____________ °Date: ____________ Movements: ____________ Start time: ____________ Finish time: ____________ °Date: ____________ Movements: ____________ Start time: ____________ Finish time: ____________  °Date: ____________ Movements: ____________ Start time: ____________ Finish time: ____________ °Date: ____________ Movements: ____________ Start time: ____________ Finish time: ____________ °Date: ____________ Movements: ____________ Start time: ____________ Finish time: ____________ °Date: ____________ Movements: ____________ Start time: ____________ Finish time: ____________ °Date: ____________ Movements: ____________ Start time: ____________ Finish time: ____________ °Date: ____________ Movements:  ____________ Start time: ____________ Finish time:   ____________ °Date: ____________ Movements: ____________ Start time: ____________ Finish time: ____________  °Date: ____________ Movements: ____________ Start time: ____________ Finish time: ____________ °Date: ____________ Movements: ____________ Start time: ____________ Finish time: ____________ °Date: ____________ Movements: ____________ Start time: ____________ Finish time: ____________ °Date: ____________ Movements: ____________ Start time: ____________ Finish time: ____________ °Date: ____________ Movements: ____________ Start time: ____________ Finish time: ____________ °Date: ____________ Movements: ____________ Start time: ____________ Finish time: ____________ °Date: ____________ Movements: ____________ Start time: ____________ Finish time: ____________  °Date: ____________ Movements: ____________ Start time: ____________ Finish time: ____________ °Date: ____________ Movements: ____________ Start time: ____________ Finish time: ____________ °Date: ____________ Movements: ____________ Start time: ____________ Finish time: ____________ °Date: ____________ Movements: ____________ Start time: ____________ Finish time: ____________ °Date: ____________ Movements: ____________ Start time: ____________ Finish time: ____________ °Date: ____________ Movements: ____________ Start time: ____________ Finish time: ____________ °Date: ____________ Movements: ____________ Start time: ____________ Finish time: ____________  °Date: ____________ Movements: ____________ Start time: ____________ Finish time: ____________ °Date: ____________ Movements: ____________ Start time: ____________ Finish time: ____________ °Date: ____________ Movements: ____________ Start time: ____________ Finish time: ____________ °Date: ____________ Movements: ____________ Start time: ____________ Finish time: ____________ °Date: ____________ Movements: ____________ Start time: ____________ Finish  time: ____________ °Date: ____________ Movements: ____________ Start time: ____________ Finish time: ____________ °Date: ____________ Movements: ____________ Start time: ____________ Finish time: ____________  °Date: ____________ Movements: ____________ Start time: ____________ Finish time: ____________ °Date: ____________ Movements: ____________ Start time: ____________ Finish time: ____________ °Date: ____________ Movements: ____________ Start time: ____________ Finish time: ____________ °Date: ____________ Movements: ____________ Start time: ____________ Finish time: ____________ °Date: ____________ Movements: ____________ Start time: ____________ Finish time: ____________ °Date: ____________ Movements: ____________ Start time: ____________ Finish time: ____________ °  °This information is not intended to replace advice given to you by your health care provider. Make sure you discuss any questions you have with your health care provider. °  °Document Released: 05/21/2006 Document Revised: 05/12/2014 Document Reviewed: 02/16/2012 °Elsevier Interactive Patient Education ©2016 [employer] Inc[employer] ° °

## 2015-12-21 ENCOUNTER — Ambulatory Visit (INDEPENDENT_AMBULATORY_CARE_PROVIDER_SITE_OTHER): Payer: Medicaid Other | Admitting: Family Medicine

## 2015-12-21 VITALS — BP 123/74 | HR 89 | Wt 173.1 lb

## 2015-12-21 DIAGNOSIS — Z3493 Encounter for supervision of normal pregnancy, unspecified, third trimester: Secondary | ICD-10-CM

## 2015-12-21 LAB — POCT URINALYSIS DIP (DEVICE)
BILIRUBIN URINE: NEGATIVE
GLUCOSE, UA: NEGATIVE mg/dL
HGB URINE DIPSTICK: NEGATIVE
KETONES UR: NEGATIVE mg/dL
Nitrite: NEGATIVE
Protein, ur: NEGATIVE mg/dL
UROBILINOGEN UA: 0.2 mg/dL (ref 0.0–1.0)
pH: 6 (ref 5.0–8.0)

## 2015-12-21 NOTE — Progress Notes (Signed)
Subjective:  Erica Bryant is a 35 y.o. [id number] at 2130w1d being seen today for ongoing prenatal care.  She is currently monitored for the following issues for this low-risk pregnancy and has Supervision of low-risk pregnancy; Cervical high risk human papillomavirus (HPV) DNA test positive; Uses marijuana; Fetal choroid plexus cysts affecting antepartum care of mother; Anemia in pregnancy; and Positive GBS test on her problem list.  Patient reports steady contractions.  Contractions: Not present.  .  Movement: Present. Denies leaking of fluid.   The following portions of the patient's history were reviewed and updated as appropriate: allergies, current medications, past family history, past medical history, past social history, past surgical history and problem list. Problem list updated.  Objective:   Vitals:   12/21/15 1007  BP: 123/74  Pulse: 89  Weight: 173 lb 1.6 oz (78.5 kg)    Fetal Status: Fetal Heart Rate (bpm): 135   Movement: Present     General:  Alert, oriented and cooperative. Patient is in no acute distress.  Skin: Skin is warm and dry. No rash noted.   Cardiovascular: Normal heart rate noted  Respiratory: Normal respiratory effort, no problems with respiration noted  Abdomen: Soft, gravid, appropriate for gestational age. Pain/Pressure: Present     Pelvic:  Cervical exam performed Dilation: 3 Effacement (%): Thick Station: -3  Extremities: Normal range of motion.  Edema: None  Mental Status: Normal mood and affect. Normal behavior. Normal judgment and thought content.   Urinalysis:      Assessment and Plan:  Pregnancy: [id number] at 8430w1d  1. Supervision of low-risk pregnancy, third trimester FHT and FH normal.  Discussed symptomatic relief from contractions  Term labor symptoms and general obstetric precautions including but not limited to vaginal bleeding, contractions, leaking of fluid and fetal movement were reviewed in detail with the patient. Please refer to  After Visit Summary for other counseling recommendations.  No Follow-up on file.   Levie HeritageJacob J Toniette Devera, DO

## 2015-12-28 ENCOUNTER — Ambulatory Visit (INDEPENDENT_AMBULATORY_CARE_PROVIDER_SITE_OTHER): Payer: Medicaid Other | Admitting: Obstetrics & Gynecology

## 2015-12-28 VITALS — BP 124/79 | HR 86 | Wt 180.1 lb

## 2015-12-28 DIAGNOSIS — O350XX1 Maternal care for (suspected) central nervous system malformation in fetus, fetus 1: Secondary | ICD-10-CM

## 2015-12-28 DIAGNOSIS — F1290 Cannabis use, unspecified, uncomplicated: Secondary | ICD-10-CM

## 2015-12-28 DIAGNOSIS — O358XX0 Maternal care for other (suspected) fetal abnormality and damage, not applicable or unspecified: Secondary | ICD-10-CM

## 2015-12-28 DIAGNOSIS — O99013 Anemia complicating pregnancy, third trimester: Secondary | ICD-10-CM

## 2015-12-28 DIAGNOSIS — Z3493 Encounter for supervision of normal pregnancy, unspecified, third trimester: Secondary | ICD-10-CM

## 2015-12-28 DIAGNOSIS — O3503X1 Maternal care for (suspected) central nervous system malformation or damage in fetus, choroid plexus cysts, fetus 1: Secondary | ICD-10-CM

## 2015-12-28 DIAGNOSIS — R87810 Cervical high risk human papillomavirus (HPV) DNA test positive: Secondary | ICD-10-CM

## 2015-12-28 DIAGNOSIS — B951 Streptococcus, group B, as the cause of diseases classified elsewhere: Secondary | ICD-10-CM

## 2015-12-28 LAB — POCT URINALYSIS DIP (DEVICE)
BILIRUBIN URINE: NEGATIVE
GLUCOSE, UA: NEGATIVE mg/dL
Hgb urine dipstick: NEGATIVE
KETONES UR: NEGATIVE mg/dL
LEUKOCYTES UA: NEGATIVE
NITRITE: NEGATIVE
PH: 6 (ref 5.0–8.0)
Protein, ur: NEGATIVE mg/dL
Specific Gravity, Urine: <=1.005 (ref 1.005–1.030)
Urobilinogen, UA: 0.2 mg/dL (ref 0.0–1.0)

## 2015-12-28 NOTE — Patient Instructions (Signed)
Group B streptococcus (GBS) is a type of bacteria often found in healthy women. GBS is not the same as the bacteria that causes strep throat. You may have GBS in your vagina, rectum, or bladder. GBS does not spread through sexual contact, but it can be passed to a baby during childbirth. This can be dangerous for your baby. It is not dangerous to you and usually does not cause any symptoms. Your health care provider may test you for GBS when your pregnancy is between 35 and 37 weeks. GBS is dangerous only during birth, so there is no need to test for it earlier. It is possible to have GBS during pregnancy and never pass it to your baby. If your test results are positive for GBS, your health care provider may recommend giving you antibiotic medicine during delivery to make sure your baby stays healthy. RISK FACTORS You are more likely to pass GBS to your baby if:   Your water breaks (ruptured membrane) or you go into labor before 37 weeks.  Your water breaks 18 hours before you deliver.  You passed GBS during a previous pregnancy.  You have a urinary tract infection caused by GBS any time during pregnancy.  You have a fever during labor. SYMPTOMS Most women who have GBS do not have any symptoms. If you have a urinary tract infection caused by GBS, you might have frequent or painful urination and fever. Babies who get GBS usually show symptoms within 7 days of birth. Symptoms may include:   Breathing problems.  Heart and blood pressure problems.  Digestive and kidney problems. DIAGNOSIS Routine screening for GBS is recommended for all pregnant women. A health care provider takes a sample of the fluid in your vagina and rectum with a swab. It is then sent to a lab to be checked for GBS. A sample of your urine may also be checked for the bacteria.  TREATMENT If you test positive for GBS, you may need treatment with an antibiotic medicine during labor. As soon as you go into labor, or as soon as  your membranes rupture, you will get the antibiotic medicine through an IV access. You will continue to get the medicine until after you give birth. You do not need antibiotic medicine if you are having a cesarean delivery.If your baby shows signs or symptoms of GBS after birth, your baby can also be treated with an antibiotic medicine. HOME CARE INSTRUCTIONS   Take all antibiotic medicine as prescribed by your health care provider. Only take medicine as directed.   Continue with prenatal visits and care.   Keep all follow-up appointments.  SEEK MEDICAL CARE IF:   You have pain when you urinate.   You have to urinate frequently.   You have a fever.  SEEK IMMEDIATE MEDICAL CARE IF:   Your membranes rupture.  You go into labor.   This information is not intended to replace advice given to you by your health care provider. Make sure you discuss any questions you have with your health care provider.   Document Released: 07/29/2007 Document Revised: 04/26/2013 Document Reviewed: 02/11/2013 Elsevier Interactive Patient Education 2016 [employer] Inc.  

## 2015-12-28 NOTE — Progress Notes (Signed)
Subjective:  Erica Bryant is a 35 y.o. [id number] at 4220w1d being seen today for ongoing prenatal care.  She is currently monitored for the following issues for this low-risk pregnancy and has Supervision of low-risk pregnancy; Cervical high risk human papillomavirus (HPV) DNA test positive; Uses marijuana; Fetal choroid plexus cysts affecting antepartum care of mother; Anemia in pregnancy; and Positive GBS test on her problem list.  Patient reports reg contractions.  Contractions: Irritability. Vag. Bleeding: None.  Movement: Present. Denies leaking of fluid.   The following portions of the patient's history were reviewed and updated as appropriate: allergies, current medications, past family history, past medical history, past social history, past surgical history and problem list. Problem list updated.  Objective:   Vitals:   12/28/15 1056  BP: 124/79  Pulse: 86  Weight: 180 lb 1.6 oz (81.7 kg)    Fetal Status: Fetal Heart Rate (bpm): 140   Movement: Present  Presentation: Vertex  General:  Alert, oriented and cooperative. Patient is in no acute distress.  Skin: Skin is warm and dry. No rash noted.   Cardiovascular: Normal heart rate noted  Respiratory: Normal respiratory effort, no problems with respiration noted  Abdomen: Soft, gravid, appropriate for gestational age. Pain/Pressure: Present     Pelvic:  Cervical exam performed Dilation: 3.5 Effacement (%): Thick Station: Ballotable  Extremities: Normal range of motion.  Edema: None  Mental Status: Normal mood and affect. Normal behavior. Normal judgment and thought content.   Urinalysis: Urine Protein: Negative Urine Glucose: Negative  Assessment and Plan:  Pregnancy: [id number] at 6520w1d  1. Supervision of low-risk pregnancy, third trimester  2. Positive GBS test atbx in labor  3. Fetal choroid plexus cysts affecting antepartum care of mother, fetus 1  4. Anemia in pregnancy, third trimester  5. Cervical high risk human  papillomavirus (HPV) DNA test positive  6. Uses marijuana   Term labor symptoms and general obstetric precautions including but not limited to vaginal bleeding, contractions, leaking of fluid and fetal movement were reviewed in detail with the patient. Please refer to After Visit Summary for other counseling recommendations.  Return in about 1 week (around 01/04/2016).   Willodean Rosenthalarolyn Harraway-Smith, MD

## 2016-01-05 ENCOUNTER — Encounter (HOSPITAL_COMMUNITY): Payer: Self-pay | Admitting: *Deleted

## 2016-01-05 ENCOUNTER — Inpatient Hospital Stay (HOSPITAL_COMMUNITY)
Admission: AD | Admit: 2016-01-05 | Discharge: 2016-01-08 | DRG: 775 | Disposition: A | Discharge status: Home / Self Care | Payer: Medicaid Other | Source: Ambulatory Visit | Attending: Obstetrics & Gynecology | Admitting: Obstetrics & Gynecology

## 2016-01-05 DIAGNOSIS — O99824 Streptococcus B carrier state complicating childbirth: Secondary | ICD-10-CM | POA: Diagnosis present

## 2016-01-05 DIAGNOSIS — F1290 Cannabis use, unspecified, uncomplicated: Secondary | ICD-10-CM | POA: Diagnosis present

## 2016-01-05 DIAGNOSIS — O99324 Drug use complicating childbirth: Secondary | ICD-10-CM | POA: Diagnosis present

## 2016-01-05 DIAGNOSIS — Z3A40 40 weeks gestation of pregnancy: Secondary | ICD-10-CM | POA: Diagnosis not present

## 2016-01-05 DIAGNOSIS — Z3483 Encounter for supervision of other normal pregnancy, third trimester: Secondary | ICD-10-CM | POA: Diagnosis present

## 2016-01-05 DIAGNOSIS — Z833 Family history of diabetes mellitus: Secondary | ICD-10-CM

## 2016-01-05 DIAGNOSIS — IMO0001 Reserved for inherently not codable concepts without codable children: Secondary | ICD-10-CM

## 2016-01-05 LAB — CBC
HCT: 31.9 % — ABNORMAL LOW (ref 36.0–46.0)
Hemoglobin: 10.6 g/dL — ABNORMAL LOW (ref 12.0–15.0)
MCH: 20.9 pg — AB (ref 26.0–34.0)
MCHC: 33.2 g/dL (ref 30.0–36.0)
MCV: 62.9 fL — ABNORMAL LOW (ref 78.0–100.0)
PLATELETS: 201 10*3/uL (ref 150–400)
RBC: 5.07 MIL/uL (ref 3.87–5.11)
RDW: 20.7 % — ABNORMAL HIGH (ref 11.5–15.5)
WBC: 13.2 10*3/uL — ABNORMAL HIGH (ref 4.0–10.5)

## 2016-01-05 MED ORDER — OXYTOCIN 40 UNITS IN LACTATED RINGERS INFUSION - SIMPLE MED
2.5000 [IU]/h | INTRAVENOUS | Status: DC
  Filled 2016-01-05: qty 1000

## 2016-01-05 MED ORDER — DIPHENHYDRAMINE HCL 50 MG/ML IJ SOLN: 12.5000 mg | INTRAMUSCULAR | Status: DC | PRN

## 2016-01-05 MED ORDER — PENICILLIN G POTASSIUM 5000000 UNITS IJ SOLR
5.0000 10*6.[IU] | Freq: Once | INTRAVENOUS | Status: AC
  Administered 2016-01-05: 5 10*6.[IU] via INTRAVENOUS
  Filled 2016-01-05: qty 5

## 2016-01-05 MED ORDER — ONDANSETRON HCL 4 MG/2ML IJ SOLN: 4.0000 mg | Freq: Four times a day (QID) | INTRAMUSCULAR | Status: DC | PRN

## 2016-01-05 MED ORDER — ACETAMINOPHEN 325 MG PO TABS: 650.0000 mg | ORAL_TABLET | ORAL | Status: DC | PRN

## 2016-01-05 MED ORDER — LACTATED RINGERS IV SOLN: 500.0000 mL | Freq: Once | INTRAVENOUS | Status: DC

## 2016-01-05 MED ORDER — OXYCODONE-ACETAMINOPHEN 5-325 MG PO TABS: 2.0000 | ORAL_TABLET | ORAL | Status: DC | PRN

## 2016-01-05 MED ORDER — LACTATED RINGERS IV SOLN: 500.0000 mL | INTRAVENOUS | Status: DC | PRN

## 2016-01-05 MED ORDER — FENTANYL 2.5 MCG/ML BUPIVACAINE 1/10 % EPIDURAL INFUSION (WH - ANES)
14.0000 mL/h | INTRAMUSCULAR | Status: DC | PRN
  Administered 2016-01-05 – 2016-01-06 (×2): 14 mL/h via EPIDURAL

## 2016-01-05 MED ORDER — FENTANYL 2.5 MCG/ML BUPIVACAINE 1/10 % EPIDURAL INFUSION (WH - ANES)
INTRAMUSCULAR | Status: AC
  Filled 2016-01-05: qty 125

## 2016-01-05 MED ORDER — PHENYLEPHRINE 40 MCG/ML (10ML) SYRINGE FOR IV PUSH (FOR BLOOD PRESSURE SUPPORT)
80.0000 ug | PREFILLED_SYRINGE | INTRAVENOUS | Status: DC | PRN
  Filled 2016-01-05: qty 5

## 2016-01-05 MED ORDER — OXYTOCIN BOLUS FROM INFUSION
500.0000 mL | Freq: Once | INTRAVENOUS | Status: AC
  Administered 2016-01-06: 500 mL via INTRAVENOUS

## 2016-01-05 MED ORDER — EPHEDRINE 5 MG/ML INJ
10.0000 mg | INTRAVENOUS | Status: DC | PRN
  Filled 2016-01-05: qty 4

## 2016-01-05 MED ORDER — LIDOCAINE HCL (PF) 1 % IJ SOLN
30.0000 mL | INTRAMUSCULAR | Status: DC | PRN
  Filled 2016-01-05: qty 30

## 2016-01-05 MED ORDER — PENICILLIN G POTASSIUM 5000000 UNITS IJ SOLR
2.5000 10*6.[IU] | INTRAVENOUS | Status: DC
  Filled 2016-01-05 (×3): qty 2.5

## 2016-01-05 MED ORDER — PHENYLEPHRINE 40 MCG/ML (10ML) SYRINGE FOR IV PUSH (FOR BLOOD PRESSURE SUPPORT)
PREFILLED_SYRINGE | INTRAVENOUS | Status: AC
Start: 2016-01-05 — End: 2016-01-06
  Filled 2016-01-05: qty 20

## 2016-01-05 MED ORDER — FLEET ENEMA 7-19 GM/118ML RE ENEM: 1.0000 | ENEMA | RECTAL | Status: DC | PRN

## 2016-01-05 MED ORDER — OXYCODONE-ACETAMINOPHEN 5-325 MG PO TABS: 1.0000 | ORAL_TABLET | ORAL | Status: DC | PRN

## 2016-01-05 MED ORDER — SOD CITRATE-CITRIC ACID 500-334 MG/5ML PO SOLN: 30.0000 mL | ORAL | Status: DC | PRN

## 2016-01-05 MED ORDER — LACTATED RINGERS IV SOLN: INTRAVENOUS | Status: DC

## 2016-01-05 NOTE — MAU Note (Signed)
Pt presents to MAU via EMS with complaints of contractions and SROM at 2208pm.

## 2016-01-05 NOTE — H&P (Signed)
LABOR AND DELIVERY ADMISSION HISTORY AND PHYSICAL NOTE  Erica Bryant is a [age] y.o. female [phone] with IUP at 4259w2d by 12 wk U/S presenting for SOL.  SROM'd at home around 10pm to meconium. Feeling uncomfortable, having ctx q3-234min. She reports positive fetal movement. She denies vaginal bleeding.  Prenatal History/Complications: none  Past Medical History: Past Medical History:  Diagnosis Date  . Anemia     Past Surgical History: Past Surgical History:  Procedure Laterality Date  . DILATION AND CURETTAGE OF UTERUS      Obstetrical History: OB History    Gravida Para Term Preterm AB Living   6 3 3  0 2 3   SAB TAB Ectopic Multiple Live Births   1 1 0 0 3      Social History: Social History   Social History  . Marital status: Single    Spouse name: N/A  . Number of children: N/A  . Years of education: N/A   Social History Main Topics  . Smoking status: Never Smoker  . Smokeless tobacco: Never Used  . Alcohol use No  . Drug use: No  . Sexual activity: Yes   Other Topics Concern  . None   Social History Narrative  . None    Family History: Family History  Problem Relation Age of Onset  . Diabetes Maternal Grandmother   . Kidney disease Maternal Grandmother     Allergies: No Known Allergies  Prescriptions Prior to Admission  Medication Sig Dispense Refill Last Dose  . acetaminophen (TYLENOL) 500 MG tablet Take 1,000 mg by mouth every 6 (six) hours as needed for mild pain or headache.   Taking  . docusate sodium (COLACE) 100 MG capsule Take 1 capsule (100 mg total) by mouth 2 (two) times daily as needed. (Patient taking differently: Take 100 mg by mouth 2 (two) times daily as needed for mild constipation. ) 30 capsule 2 Taking  . Fe Fum-FePoly-FA-Vit C-Vit B3 (INTEGRA F) 125-1 MG CAPS Take 1 capsule by mouth daily. 30 capsule 3 Taking  . ferrous sulfate (FERROUSUL) 325 (65 FE) MG tablet Take 1 tablet (325 mg total) by mouth daily with breakfast. 60  tablet 1 Taking  . Prenatal Vit-Fe Fumarate-FA (PRENATAL MULTIVITAMIN) TABS tablet Take 1 tablet by mouth daily at 12 noon.   Taking     Review of Systems   All systems reviewed and negative except as stated in HPI  Blood pressure 134/81, pulse 97, temperature 99.1 F (37.3 C), resp. rate 18, height 5\' 7"  (1.702 m), weight 81.6 kg (180 lb), last menstrual period 03/29/2015. General appearance: alert, cooperative and mild distress Lungs: no respiratory distress Heart: regular rate Abdomen: soft, non-tender Extremities: No calf swelling or tenderness Presentation: cephalic by cervical exam in MAU by RN exam Fetal monitoring: 130 baseline, moderate variability, +accelerations, no decelerations Uterine activity: q2-394min Dilation: 3.5 Effacement (%): 50 Exam by:: Ginger morris rn   Prenatal labs: ABO, Rh: O/POS/-- (02/20 1426) Antibody: NEG (02/20 1426) Rubella: immune RPR: NON REAC (05/30 1405)  HBsAg: NEGATIVE (02/20 1426)  HIV: NONREACTIVE (05/30 1405)  GBS: Positive (08/02 0000)  Genetic screening:  normal Anatomy US: normal  Prenatal Transfer Tool  Maternal Diabetes: No Genetic Screening: Normal Maternal Ultrasounds/Referrals: Normal Fetal Ultrasounds or other Referrals:  None Maternal Substance Abuse:  No Significant Maternal Medications:  None Significant Maternal Lab Results: Lab values include: Group B Strep positive  No results found for this or any previous visit (from the past 24 hour(s)).  Patient Active Problem List   Diagnosis Date Noted  . Active labor at term 01/05/2016  . Positive GBS test 12/10/2015  . Anemia in pregnancy 10/07/2015  . Fetal choroid plexus cysts affecting antepartum care of mother 08/14/2015  . Uses marijuana 06/30/2015  . Cervical high risk human papillomavirus (HPV) DNA test positive 06/29/2015  . Supervision of low-risk pregnancy 06/25/2015    Assessment: Erica Bryant is a 35 y.o. [id number] at [redacted]w[redacted]d here for  SOL  #Labor:expectant management #Pain: epidural #FWB: Category I #ID:  GBS postitive #MOF: br #MOC:depo #Circ: Girl, N/A  Leland Her, DO PGY-1 9/2/201711:41 PM   OB FELLOW HISTORY AND PHYSICAL ATTESTATION  I have seen and examined this patient; I agree with above documentation in the resident's note.    Jen Mow, DO OB Fellow 01/06/2016, 12:05 AM

## 2016-01-05 NOTE — Anesthesia Preprocedure Evaluation (Signed)
Anesthesia Evaluation  Patient identified by MRN, date of birth, ID band Patient awake    Reviewed: Allergy & Precautions, NPO status , Patient's Chart, lab work & pertinent test results  Airway Mallampati: II  TM Distance: >3 FB Neck ROM: Full    Dental no notable dental hx.    Pulmonary neg pulmonary ROS,    Pulmonary exam normal breath sounds clear to auscultation       Cardiovascular negative cardio ROS Normal cardiovascular exam Rhythm:Regular Rate:Normal     Neuro/Psych negative neurological ROS  negative psych ROS   GI/Hepatic negative GI ROS, Neg liver ROS,   Endo/Other  negative endocrine ROS  Renal/GU negative Renal ROS  negative genitourinary   Musculoskeletal negative musculoskeletal ROS (+)   Abdominal   Peds negative pediatric ROS (+)  Hematology negative hematology ROS (+)   Anesthesia Other Findings   Reproductive/Obstetrics negative OB ROS                             Anesthesia Physical Anesthesia Plan  ASA: II  Anesthesia Plan: Epidural   Post-op Pain Management:    Induction: Intravenous  Airway Management Planned: Natural Airway  Additional Equipment:   Intra-op Plan:   Post-operative Plan:   Informed Consent: I have reviewed the patients History and Physical, chart, labs and discussed the procedure including the risks, benefits and alternatives for the proposed anesthesia with the patient or authorized representative who has indicated his/her understanding and acceptance.   Dental advisory given  Plan Discussed with: CRNA  Anesthesia Plan Comments: (Informed consent obtained prior to proceeding including risk of failure, 1% risk of PDPH, risk of minor discomfort and bruising.  Discussed rare but serious complications including epidural abscess, permanent nerve injury, epidural hematoma.  Discussed alternatives to epidural analgesia and patient desires  to proceed.  Timeout performed pre-procedure verifying patient name, procedure, and platelet count.  Patient tolerated procedure well.)        Anesthesia Quick Evaluation  

## 2016-01-06 ENCOUNTER — Inpatient Hospital Stay (HOSPITAL_COMMUNITY): Payer: Medicaid Other | Admitting: Anesthesiology

## 2016-01-06 ENCOUNTER — Encounter (HOSPITAL_COMMUNITY): Payer: Self-pay | Admitting: *Deleted

## 2016-01-06 DIAGNOSIS — Z3A40 40 weeks gestation of pregnancy: Secondary | ICD-10-CM

## 2016-01-06 DIAGNOSIS — O99824 Streptococcus B carrier state complicating childbirth: Secondary | ICD-10-CM

## 2016-01-06 LAB — TYPE AND SCREEN
ABO/RH(D): O POS
Antibody Screen: NEGATIVE

## 2016-01-06 LAB — ABO/RH: ABO/RH(D): O POS

## 2016-01-06 LAB — RAPID HIV SCREEN (HIV 1/2 AB+AG)
HIV 1/2 Antibodies: NONREACTIVE
HIV-1 P24 Antigen - HIV24: NONREACTIVE

## 2016-01-06 LAB — RPR: RPR: NONREACTIVE

## 2016-01-06 MED ORDER — LACTATED RINGERS IV SOLN: 500.0000 mL | Freq: Once | INTRAVENOUS | Status: DC

## 2016-01-06 MED ORDER — TETANUS-DIPHTH-ACELL PERTUSSIS 5-2.5-18.5 LF-MCG/0.5 IM SUSP: 0.5000 mL | Freq: Once | INTRAMUSCULAR | Status: DC

## 2016-01-06 MED ORDER — COCONUT OIL OIL: 1.0000 "application " | TOPICAL_OIL | Status: DC | PRN

## 2016-01-06 MED ORDER — SENNOSIDES-DOCUSATE SODIUM 8.6-50 MG PO TABS
2.0000 | ORAL_TABLET | ORAL | Status: DC
  Administered 2016-01-06 – 2016-01-08 (×2): 2 via ORAL
  Filled 2016-01-06 (×2): qty 2

## 2016-01-06 MED ORDER — ONDANSETRON HCL 4 MG PO TABS: 4.0000 mg | ORAL_TABLET | ORAL | Status: DC | PRN

## 2016-01-06 MED ORDER — WITCH HAZEL-GLYCERIN EX PADS: 1.0000 "application " | MEDICATED_PAD | CUTANEOUS | Status: DC | PRN

## 2016-01-06 MED ORDER — IBUPROFEN 600 MG PO TABS
600.0000 mg | ORAL_TABLET | Freq: Four times a day (QID) | ORAL | Status: DC
  Administered 2016-01-06 – 2016-01-08 (×10): 600 mg via ORAL
  Filled 2016-01-06 (×10): qty 1

## 2016-01-06 MED ORDER — DIBUCAINE 1 % RE OINT: 1.0000 "application " | TOPICAL_OINTMENT | RECTAL | Status: DC | PRN

## 2016-01-06 MED ORDER — ZOLPIDEM TARTRATE 5 MG PO TABS: 5.0000 mg | ORAL_TABLET | Freq: Every evening | ORAL | Status: DC | PRN

## 2016-01-06 MED ORDER — DIPHENHYDRAMINE HCL 25 MG PO CAPS
25.0000 mg | ORAL_CAPSULE | Freq: Four times a day (QID) | ORAL | Status: DC | PRN
  Administered 2016-01-08: 25 mg via ORAL
  Filled 2016-01-06: qty 1

## 2016-01-06 MED ORDER — ACETAMINOPHEN 325 MG PO TABS: 650.0000 mg | ORAL_TABLET | ORAL | Status: DC | PRN

## 2016-01-06 MED ORDER — BENZOCAINE-MENTHOL 20-0.5 % EX AERO
1.0000 "application " | INHALATION_SPRAY | CUTANEOUS | Status: DC | PRN
  Filled 2016-01-06: qty 56

## 2016-01-06 MED ORDER — LIDOCAINE HCL (PF) 1 % IJ SOLN
INTRAMUSCULAR | Status: DC | PRN
  Administered 2016-01-06 (×2): 3 mL

## 2016-01-06 MED ORDER — SIMETHICONE 80 MG PO CHEW: 80.0000 mg | CHEWABLE_TABLET | ORAL | Status: DC | PRN

## 2016-01-06 MED ORDER — ONDANSETRON HCL 4 MG/2ML IJ SOLN: 4.0000 mg | INTRAMUSCULAR | Status: DC | PRN

## 2016-01-06 MED ORDER — PRENATAL MULTIVITAMIN CH
1.0000 | ORAL_TABLET | Freq: Every day | ORAL | Status: DC
  Administered 2016-01-06 – 2016-01-08 (×3): 1 via ORAL
  Filled 2016-01-06 (×3): qty 1

## 2016-01-06 NOTE — Anesthesia Postprocedure Evaluation (Signed)
Anesthesia Post Note  Patient: Erica Bryant  Procedure(s) Performed: * No procedures listed *  Patient location during evaluation: Mother Baby Anesthesia Type: Epidural Level of consciousness: awake and alert Pain management: pain level controlled Vital Signs Assessment: post-procedure vital signs reviewed and stable Respiratory status: spontaneous breathing, nonlabored ventilation and respiratory function stable Cardiovascular status: stable Postop Assessment: no headache, no backache, epidural receding and patient able to bend at knees Anesthetic complications: no     Last Vitals:  Vitals:   01/06/16 0320 01/06/16 0422  BP: 123/68 122/71  Pulse: 94 (!) 106  Resp: 18 18  Temp: 37.3 C 36.4 C    Last Pain:  Vitals:   01/06/16 0422  TempSrc: Oral  PainSc:    Pain Goal:                 [name],[name]

## 2016-01-06 NOTE — Progress Notes (Signed)
IV removed per orders with catheter intact.

## 2016-01-06 NOTE — Lactation Note (Signed)
This note was copied from a baby's chart. Lactation Consultation Note  Initial consult with th experienced breast feeding mom, and term baby. Mom reports breastfeeding going well, and denies needing any lactation help this admission. Lactation services briefly reviewed with mom, as well as some breastfeeding teaching from Baby and Me booklet. Mom knows to call for questions/concerns. Patient Name: Erica Bryant [id number]'BToday's Date: 01/06/2016       Maternal Data Formula Feeding for Exclusion: No Has patient been taught Hand Expression?: No (this is mom's fourth baby, denies needing lactation help) Does the patient have breastfeeding experience prior to this delivery?: Yes  Feeding Feeding Type: Breast Fed  LATCH Score/Interventions                      Lactation Tools Discussed/Used     Consult Status Consult Status: PRN Follow-up type: Call as needed    Alfred LevinsLee, Erica Bryant 01/06/2016, 1:11 PM

## 2016-01-06 NOTE — Progress Notes (Signed)
Patient ID: Erica Bryant, female   DOB: 1980-10-12, 35 y.o.   MRN: [id number] LABOR PROGRESS NOTE  Erica Bryant is a 35 y.o. [id number] at 3126w3d  admitted for SOL  Subjective: More comfortable with epidural  Objective: BP 113/71   Pulse 94   Temp 99.1 F (37.3 C)   Resp 18   Ht 5\' 7"  (1.702 m)   Wt 81.6 kg (180 lb)   LMP 03/29/2015 (Exact Date)   SpO2 96%   BMI 28.19 kg/m  or  Vitals:   01/05/16 2259 01/05/16 2305 01/06/16 0032 01/06/16 0036  BP: 134/81   113/71  Pulse: 97  99 94  Resp: 18     Temp:      SpO2:    96%  Weight:  81.6 kg (180 lb)    Height:  5\' 7"  (1.702 m)       Dilation: 3.5 Effacement (%): 50 Cervical Position: Middle Presentation: Vertex Exam by:: Ginger morris rn  6.5/90/-1  FHT: 130 baseline, mod variability +accelerations, occ early decelerations Uterine activity: q2-353min  Labs: Lab Results  Component Value Date   WBC 13.2 (H) 01/05/2016   HGB 10.6 (L) 01/05/2016   HCT 31.9 (L) 01/05/2016   MCV 62.9 (L) 01/05/2016   PLT 201 01/05/2016    Patient Active Problem List   Diagnosis Date Noted  . Active labor at term 01/05/2016  . Positive GBS test 12/10/2015  . Anemia in pregnancy 10/07/2015  . Fetal choroid plexus cysts affecting antepartum care of mother 08/14/2015  . Uses marijuana 06/30/2015  . Cervical high risk human papillomavirus (HPV) DNA test positive 06/29/2015  . Supervision of low-risk pregnancy 06/25/2015    Assessment / Plan: 35 y.o. [id number] at 3226w3d here for SOL  Labor: expectant management Fetal Wellbeing:  Category I Pain Control:  epidural Anticipated MOD:  SVD  Leland HerElsia J Harley Fitzwater, DO PGY-1 9/3/201712:53 AM

## 2016-01-06 NOTE — Anesthesia Procedure Notes (Signed)
Epidural Patient location during procedure: OB  Staffing Anesthesiologist: Cenia Zaragosa  Preanesthetic Checklist Completed: patient identified, site marked, surgical consent, pre-op evaluation, timeout performed, IV checked, risks and benefits discussed and monitors and equipment checked  Epidural Patient position: sitting Prep: DuraPrep Patient monitoring: blood pressure and heart rate Approach: midline Location: L4-L5 Injection technique: LOR saline  Needle:  Needle type: Tuohy  Needle gauge: 17 G Needle length: 9 cm Needle insertion depth: 5 cm Catheter type: closed end flexible Catheter size: 19 Gauge Catheter at skin depth: 13 cm Test dose: negative and Other  Assessment Events: blood not aspirated, injection not painful, no injection resistance, negative IV test and no paresthesia  Additional Notes Reason for block:procedure for pain     

## 2016-01-06 NOTE — Plan of Care (Signed)
Problem: Urinary Elimination: Goal: Ability to reestablish a normal urinary elimination pattern will improve Outcome: Completed/Met Date Met: 01/06/16 Pt walked to bathroom without assistance and was able to urinate.

## 2016-01-07 ENCOUNTER — Encounter (HOSPITAL_COMMUNITY): Payer: Self-pay

## 2016-01-07 NOTE — Clinical Social Work Maternal (Signed)
  CLINICAL SOCIAL WORK MATERNAL/CHILD NOTE  Patient Details  Name: Pang Robers MRN: [id number] Date of Birth: [birth year]  Date:  01/07/2016  Clinical Social Worker Initiating Note:  Laurey Arrow Date/ Time Initiated:  01/07/16/1124     Child's Name:  Oren Beckmann   Legal Guardian:  Mother   Need for Interpreter:  None   Date of Referral:  01/07/16     Reason for Referral:  Current Substance Use/Substance Use During Pregnancy    Referral Source:  Central Nursery   Address:  [place] [address]  Phone number:  [phone]   Household Members:  Self, Minor Children, Significant Other   Natural Supports (not living in the home):  Other (Comment)   Professional Supports: Case Manager/Social Worker   Employment: Unemployed   Type of Work:     Education:  Database administrator Resources:  [employer]   Other Resources:  [employer], Physicist, medical    Cultural/Religious Considerations Which May Impact Care: None Reported  Strengths:  Ability to meet basic needs , Home prepared for child , Pediatrician chosen    Risk Factors/Current Problems:  Substance Use    Cognitive State:  Linear Thinking , Insightful    Mood/Affect:  Happy , Interested , Comfortable    CSW Assessment: CSW met with MOB to complete an assessment for hx of THC during pregnancy. MOB was inviting and polite. MOB introduced her room guest as FOB Drue Flirt), and gave CSW to meet with MOB while FOB was present.  CSW inquired about MOB's substance use and MOB acknowledged the use of marijuana during pregnancy up until about 3 months ago.  However, MOB appeared surprised when CSW informed MOB of a positive UDS for MOB on 12/05/2015. MOB was adamant that MOB had not utilized marijuana in about 3 months. CSW informed MOB of the hospital's drug screen policy, and informed MOB and FOB of the 2 screenings for the infant. CSW informed MOB of infant's negative UDS and made MOB aware that  CSW will monitor the infant's cord and will make a report to [facility] CPS if the infant has a positive result with not explanations. MOB and FOB understood the hospital's policy. MOB declined resources for substance abuse. CSW educated MOB and FOB about PPD.  CSW informed MOB and FOB of possible supports and interventions to decrease PPD.  CSW also encouraged MOB to seek medical attention if needed for increased signs and symptoms for PPD. CSW reviewed safe sleep and SIDS. MOB and FOB were knowledgeable.  MOB communicated that she has a bassinet for the baby and is aware of safe sleep interventions. CSW thanked both parents for her willingness to meet with CSW.  MOB did not have any further questions, concerns, or needs at this time.   During the assessment, FOB was attentive and appropriate with infant CSW Plan/Description:  Patient/Family Education , No Further Intervention Required/No Barriers to Discharge, Information/Referral to [employer]  (CSW will monitor infant's cord and will make a report to [place] if needed. )   Laurey Arrow, MSW, LCSW Clinical Social Work [phone]   Dimple Nanas, LCSW 01/07/2016, 11:27 AM

## 2016-01-07 NOTE — Progress Notes (Signed)
Patient ID: Erica Bryant, female   DOB: [birth year], 35 y.o.   MRN: [id number] POSTPARTUM PROGRESS NOTE  Post Partum Day 1  Subjective:  Erica SerShalisha Stofer is a 35 y.o. [id number] s/p NSVD 5767w3d.  No acute events overnight.  Pt denies problems with ambulating, voiding or po intake.  She denies nausea or vomiting.  Pain is well controlled.  Lochia Minimal.   Objective: Blood pressure (!) 97/58, pulse 72, temperature 98.2 F (36.8 C), temperature source Oral, resp. rate 18, height 5\' 7"  (1.702 m), weight 180 lb (81.6 kg), last menstrual period 03/29/2015, SpO2 99 %, unknown if currently breastfeeding.  Physical Exam:  General: alert, cooperative and no distress Lochia:normal flow Chest: no respiratory distress Heart:regular rate, distal pulses intact Abdomen: soft, nontender,  Uterine Fundus: firm, appropriately tender DVT Evaluation: No calf swelling or tenderness Extremities: no edema   Recent Labs  01/05/16 2320  HGB 10.6*  HCT 31.9*    Assessment/Plan: ASSESSMENT: Erica SerShalisha Lizama is a 35 y.o. [id number] 6267w3d s/p NSVD.   Plan for discharge tomorrow, Breastfeeding and Contraception Depo   LOS: 2 days   Kandra NicolasJulie P DegeleMD 01/07/2016, 7:43 AM   CNM attestation Post Partum Day #1 I have seen and examined this patient and agree with above documentation in the resident's note.   Erica SerShalisha Yielding is a 35 y.o. [id number] s/p SVD.  Pt denies problems with ambulating, voiding or po intake. Pain is well controlled.  Plan for birth control is Depo-Provera.  Method of Feeding: breast  PE:  BP (!) 97/58   Pulse 72   Temp 98.2 F (36.8 C) (Oral)   Resp 18   Ht 5\' 7"  (1.702 m)   Wt 81.6 kg (180 lb)   LMP 03/29/2015 (Exact Date)   SpO2 99%   Breastfeeding? Unknown   BMI 28.19 kg/m  Fundus firm  Plan for discharge: 01/08/16  Cam HaiSHAW, KIMBERLY, CNM 11:06 AM  01/07/2016

## 2016-01-08 MED ORDER — IBUPROFEN 600 MG PO TABS: 600.0000 mg | ORAL_TABLET | Freq: Four times a day (QID) | ORAL | 0 refills | Status: DC

## 2016-01-08 NOTE — Discharge Summary (Signed)
OB Discharge Summary     Patient Name: Erica Bryant DOB: 06-17-1980 MRN: [id number]  Date of admission: 01/05/2016 Delivering MD: Leland Her   Date of discharge: 01/08/2016  Admitting diagnosis: 40.2 WKS, WATER BROKE Intrauterine pregnancy: [redacted]w[redacted]d     Secondary diagnosis:  Active Problems:   * No active hospital problems. *  Additional problems: None     Discharge diagnosis: Term Pregnancy Delivered                                                                                                Post partum procedures:NOne  Augmentation: None  Complications: None  Hospital course:  Onset of Labor With Vaginal Delivery     35 y.o. yo [id number] at [redacted]w[redacted]d was admitted in Active Labor on 01/05/2016. Patient had an uncomplicated labor course as follows:  Membrane Rupture Time/Date: 10:08 PM ,01/05/2016   Intrapartum Procedures: Episiotomy: None [1]                                         Lacerations:  1st degree [2]  Patient had a delivery of a Viable infant. 01/06/2016  Information for the patient's newborn:  Erica, Bryant [[id number]]  Delivery Method: Vag-Spont    Pateint had an uncomplicated postpartum course.  She is ambulating, tolerating a regular diet, passing flatus, and urinating well. Patient is discharged home in stable condition on 01/08/16.    Physical exam Vitals:   01/06/16 1900 01/07/16 0607 01/07/16 1837 01/08/16 0500  BP: 120/66 (!) 97/58 113/75 97/60  Pulse: 87 72 89 68  Resp: 18 18 18 18   Temp: 98.2 F (36.8 C) 98.2 F (36.8 C) 97.8 F (36.6 C) 98.1 F (36.7 C)  TempSrc: Oral Oral Oral Oral  SpO2: 99%     Weight:      Height:       General: alert, cooperative and no distress Lochia: appropriate Uterine Fundus: firm Incision: N/A DVT Evaluation: Negative Homan's sign. Labs: Lab Results  Component Value Date   WBC 13.2 (H) 01/05/2016   HGB 10.6 (L) 01/05/2016   HCT 31.9 (L) 01/05/2016   MCV 62.9 (L) 01/05/2016   PLT 201 01/05/2016    No flowsheet data found.  Discharge instruction: per After Visit Summary and "Baby and Me Booklet".  After visit meds:    Medication List    STOP taking these medications   acetaminophen 500 MG tablet Commonly known as:  TYLENOL   ferrous sulfate 325 (65 FE) MG tablet Commonly known as:  FERROUSUL     TAKE these medications   docusate sodium 100 MG capsule Commonly known as:  COLACE Take 1 capsule (100 mg total) by mouth 2 (two) times daily as needed. What changed:  reasons to take this   ibuprofen 600 MG tablet Commonly known as:  ADVIL,MOTRIN Take 1 tablet (600 mg total) by mouth every 6 (six) hours.   prenatal multivitamin Tabs tablet Take 1 tablet by mouth daily at 12 noon.  Diet: routine diet  Activity: Advance as tolerated. Pelvic rest for 6 weeks.   Outpatient follow up:6 weeks Follow up Appt:No future appointments. Follow up Visit:No Follow-up on file. Follow-up Information    [facility]   Specialty:  Obstetrics and Gynecology Why:  postpartum visit Contact information: [address] [place] [id number] [phone]       [facility] ADMISSIONS .   Why:  as needed in emergencies Contact information: [address] [id number] [place]c [place] [id number] [phone]           Medication List    STOP taking these medications   acetaminophen 500 MG tablet Commonly known as:  TYLENOL   ferrous sulfate 325 (65 FE) MG tablet Commonly known as:  FERROUSUL     TAKE these medications   docusate sodium 100 MG capsule Commonly known as:  COLACE Take 1 capsule (100 mg total) by mouth 2 (two) times daily as needed. What changed:  reasons to take this   ibuprofen 600 MG tablet Commonly known as:  ADVIL,MOTRIN Take 1 tablet (600 mg total) by mouth every 6 (six) hours.   prenatal multivitamin Tabs tablet Take 1 tablet  by mouth daily at 12 noon.       Postpartum contraception: Depo Provera  Newborn Data: Live born female  Birth Weight: 7 lb 11.1 oz (3490 g) APGAR: 9, 9  Baby Feeding: Breast Disposition:home with mother   01/08/2016 Dorathy KinsmanVirginia Mija Effertz, CNM

## 2016-01-08 NOTE — Discharge Instructions (Signed)

## 2016-01-08 NOTE — Lactation Note (Signed)
This note was copied from a baby's chart. Lactation Consultation Note  Patient Name: Erica Bryant ZOXWR'UToday's Date: 01/08/2016   Baby 57 hours old. Mom nursing baby on left breast in cradle position. Mom reports that nursing is going well, and she denies any nipple soreness. Mom aware of OP/BFSG and LC phone line assistance after D/C.   Maternal Data    Feeding Feeding Type: Breast Fed  LATCH Score/Interventions Latch: Grasps breast easily, tongue down, lips flanged, rhythmical sucking.  Audible Swallowing: A few with stimulation Intervention(s): Skin to skin  Type of Nipple: Everted at rest and after stimulation  Comfort (Breast/Nipple): Soft / non-tender     Hold (Positioning): No assistance needed to correctly position infant at breast.  LATCH Score: 9  Lactation Tools Discussed/Used     Consult Status      Sherlyn HayJennifer D Mansi Tokar 01/08/2016, 11:09 AM

## 2016-01-09 ENCOUNTER — Encounter: Payer: Medicaid Other | Admitting: Student

## 2016-01-14 ENCOUNTER — Encounter: Payer: Self-pay | Admitting: Obstetrics & Gynecology

## 2016-01-14 ENCOUNTER — Telehealth: Payer: Self-pay | Admitting: *Deleted

## 2016-01-14 NOTE — Telephone Encounter (Signed)
Erica Bryant left a voicemessage this afternoon stating she needs her due date faxed to her job.

## 2016-01-16 NOTE — Telephone Encounter (Signed)
Called Erica Bryant and informed her we can not fax her EDD unless she comes by to sign a release of information. She voices understanding.

## 2016-01-31 ENCOUNTER — Encounter: Payer: Self-pay | Admitting: Family Medicine

## 2016-02-11 ENCOUNTER — Ambulatory Visit (INDEPENDENT_AMBULATORY_CARE_PROVIDER_SITE_OTHER): Payer: Medicaid Other | Admitting: Obstetrics and Gynecology

## 2016-02-11 ENCOUNTER — Encounter: Payer: Self-pay | Admitting: Obstetrics and Gynecology

## 2016-02-11 VITALS — BP 105/75 | HR 81 | Wt 164.1 lb

## 2016-02-11 DIAGNOSIS — Z3202 Encounter for pregnancy test, result negative: Secondary | ICD-10-CM

## 2016-02-11 DIAGNOSIS — Z30013 Encounter for initial prescription of injectable contraceptive: Secondary | ICD-10-CM | POA: Diagnosis present

## 2016-02-11 MED ORDER — MEDROXYPROGESTERONE ACETATE 150 MG/ML IM SUSP
150.0000 mg | INTRAMUSCULAR | Status: AC
  Administered 2016-02-11 – 2017-03-12 (×5): 150 mg via INTRAMUSCULAR

## 2016-02-11 NOTE — Progress Notes (Signed)
Entered in error

## 2016-02-11 NOTE — Progress Notes (Signed)
Subjective:     Erica Bryant is a 35 y.o. female who presents for a postpartum visit. She is 4 weeks postpartum following a spontaneous vaginal delivery. I have fully reviewed the prenatal and intrapartum course. The delivery was at 40 and 3 gestational weeks. Outcome: spontaneous vaginal delivery. Anesthesia: epidural. Postpartum course has been unremarkable. Baby's course has been unremarkable. Baby is feeding by both breast and bottle - Similac Advance. Bleeding no bleeding. Bowel function is normal. Bladder function is normal. Patient is sexually active. Contraception method is Depo-Provera injections. Postpartum depression screening: negative.  The following portions of the patient's history were reviewed and updated as appropriate: past social history, past surgical history and problem list.  Review of Systems Pertinent items are noted in HPI.   Objective:    BP 105/75   Pulse 81   Wt 164 lb 1.6 oz (74.4 kg)   BMI 25.70 kg/m   General:  alert, cooperative and appears stated age   Breasts:  defered  Lungs: clear to auscultation bilaterally and normal percussion bilaterally  Heart:  regular rate and rhythm, S1, S2 normal, no murmur, click, rub or gallop  Abdomen: soft, non-tender; bowel sounds normal; no masses,  no organomegaly   Vulva:  normal  Vagina: normal vagina  Cervix:  no cervical motion tenderness  Corpus: normal  Adnexa:  normal adnexa  Rectal Exam: Not performed.        Assessment:     Normal postpartum exam. Pap smear not done at today's visit.   Plan:    1. Contraception: Depo-Provera injections given today 2. Normal post partum exam 3. Follow up in: 3 month for PAP and Depo or as needed.

## 2016-04-29 ENCOUNTER — Ambulatory Visit: Payer: Medicaid Other

## 2016-05-05 IMAGING — US US MFM OB COMPLETE +14 WKS
1 series · 13 of 28 positions shown · non-contrast
Comparison: none

[Series 1: us mfm ob complete +14 wks · 0.18mm/px · 13 of 33 slices shown]
[im 2/33]
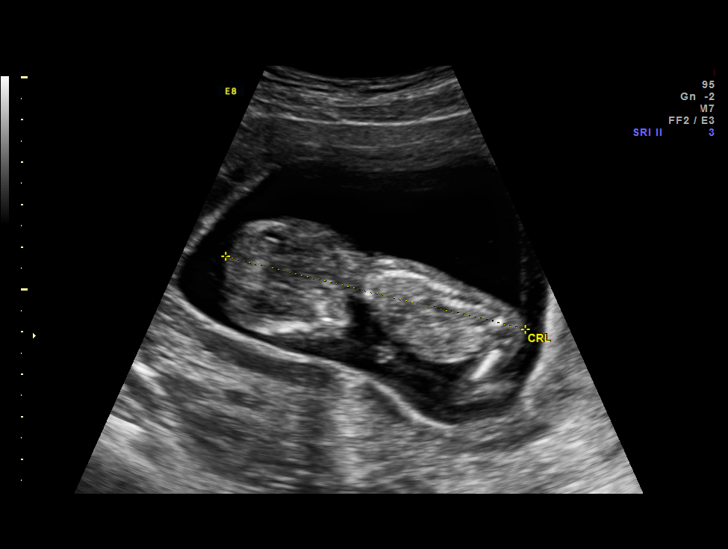
[im 4/33]
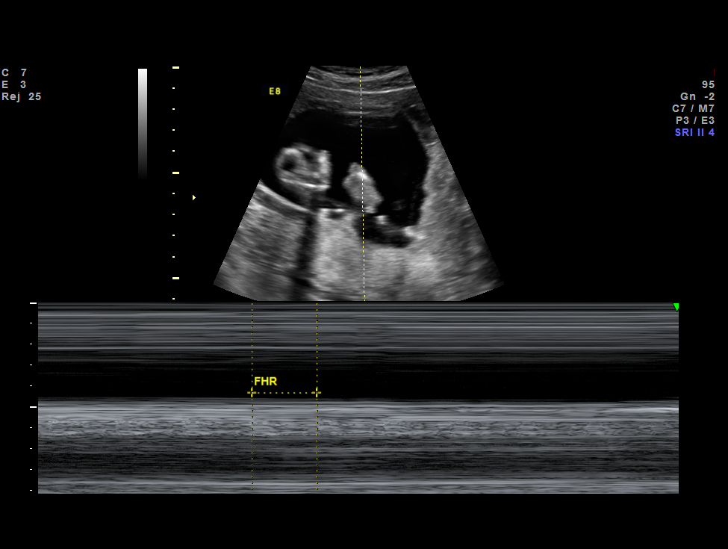
[im 6/33]
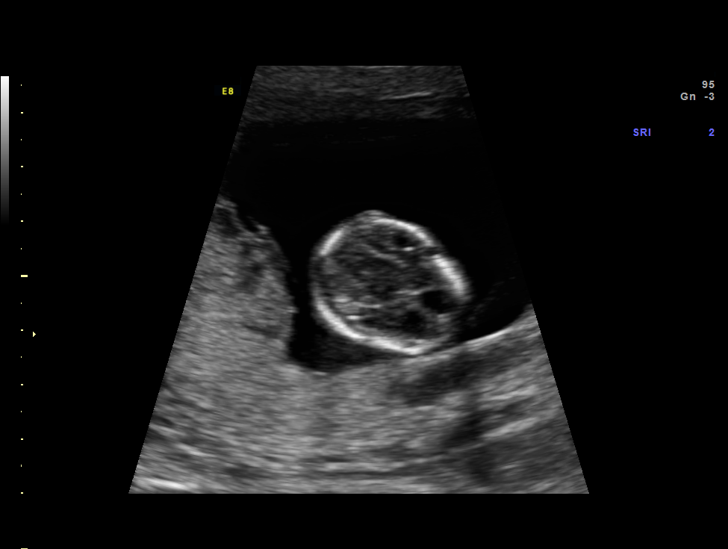
[im 9/33]
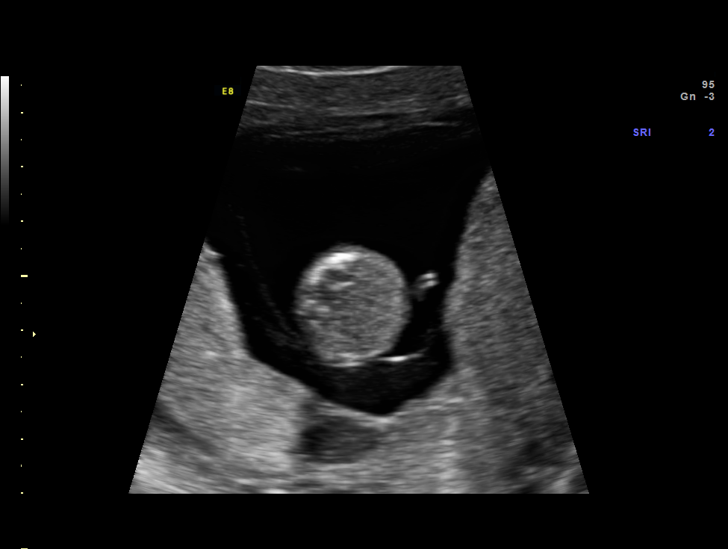
[im 11/33]
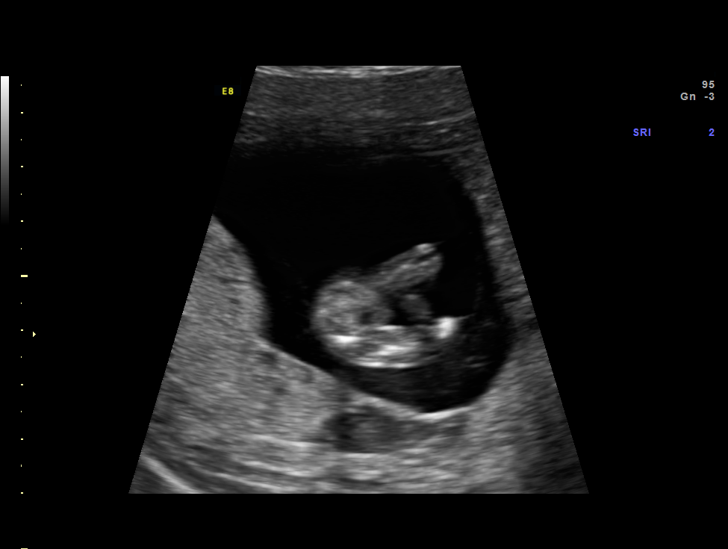
[im 14/33]
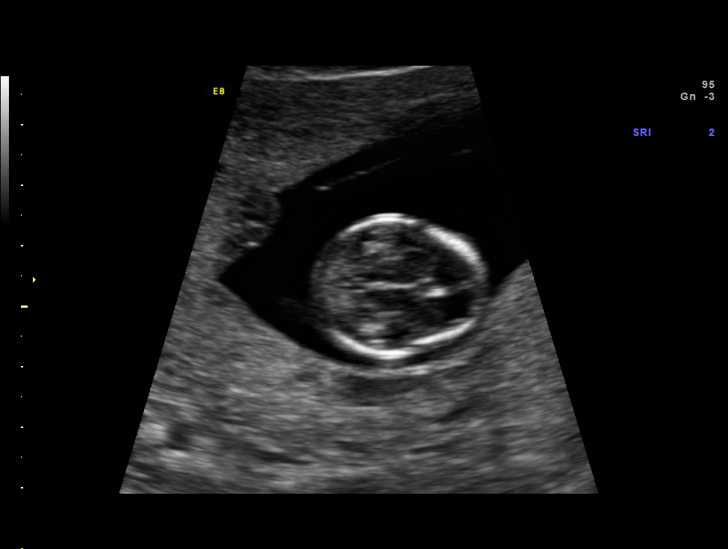
[im 17/33]
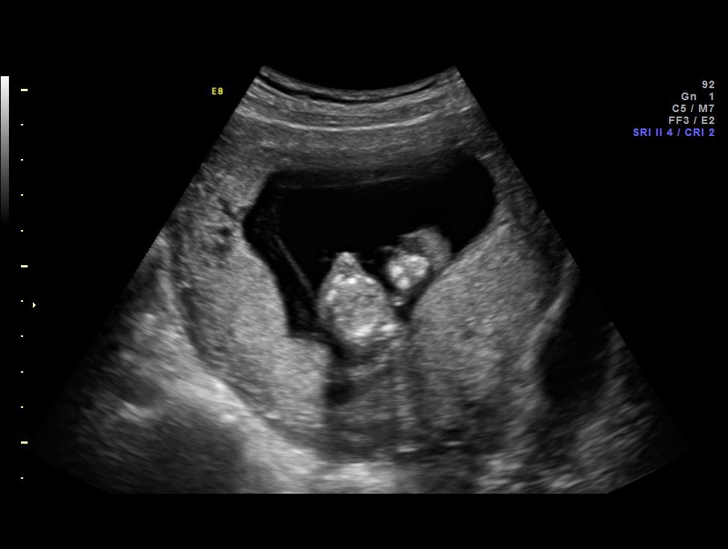
[im 19/33]
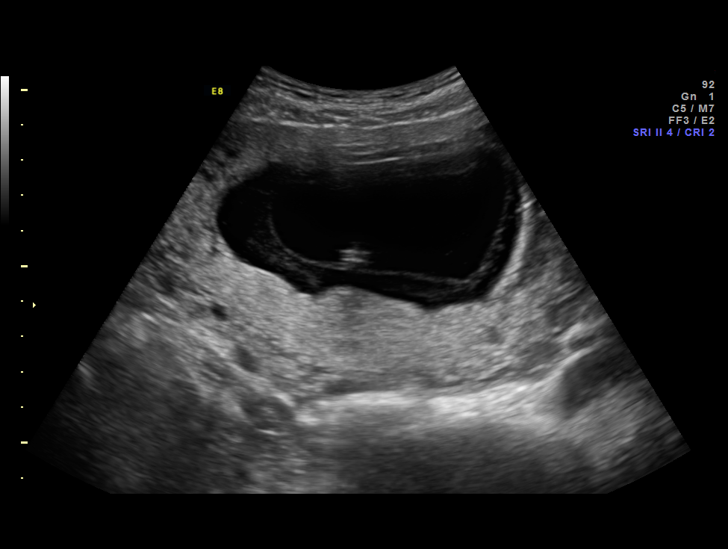
[im 22/33]
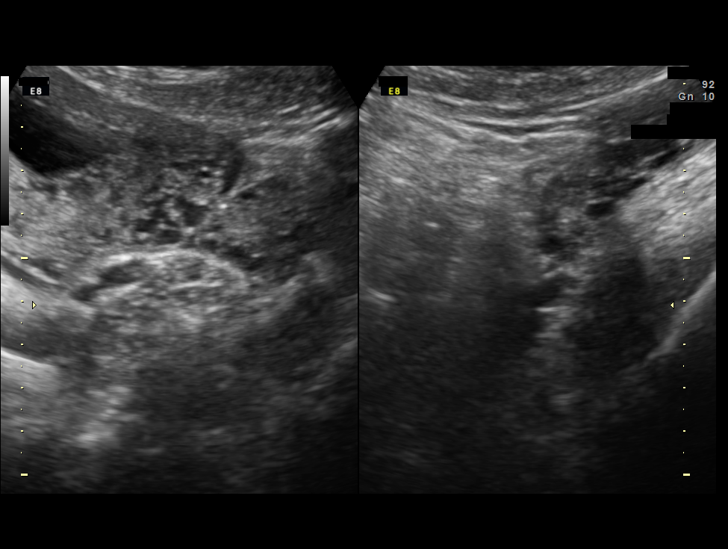
[im 24/33]
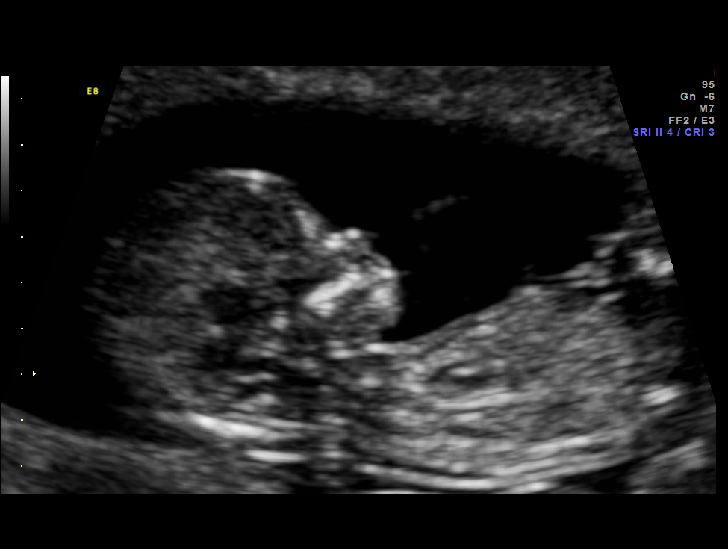
[im 27/33]
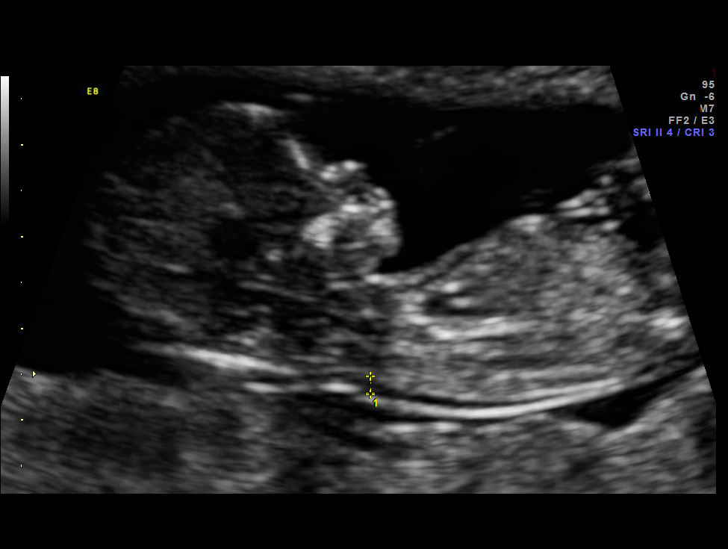
[im 29/33]
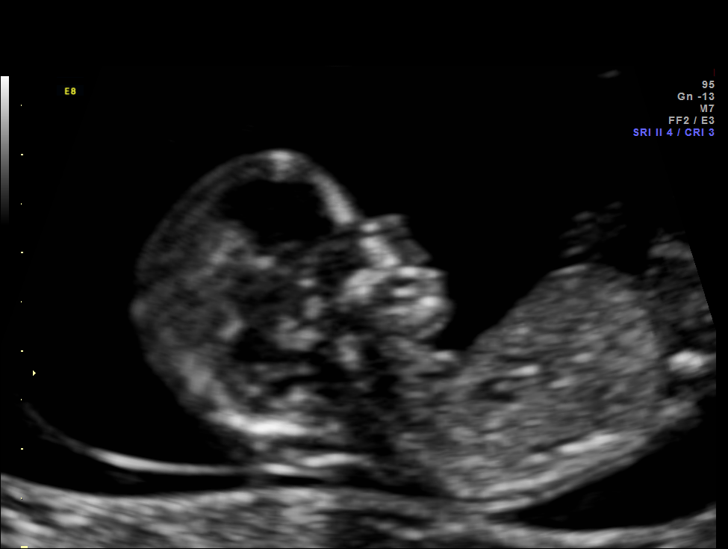
[im 31/33]
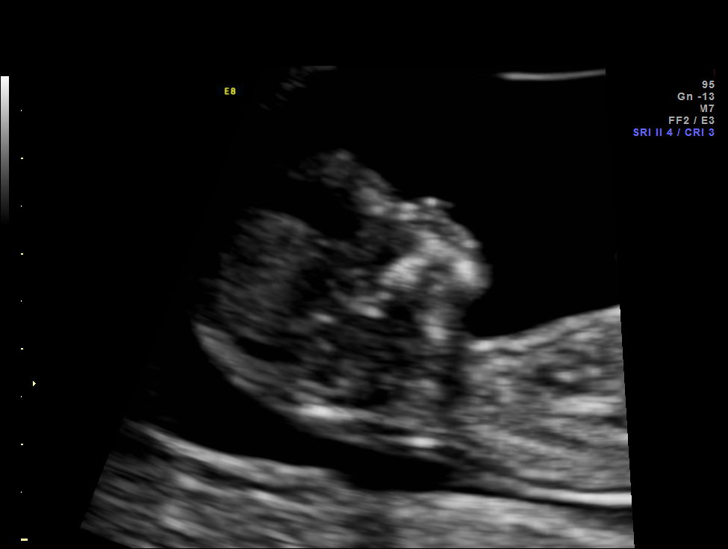

[13 of 28 positions shown; findings below may reference images not displayed]

Hospital Clinic-
Faculty Physician
OB/Gyn Clinic
DATABEX

TRANSLUCENCY
WEEKS

1  NUKUNU GERVASE          [id number]      [id number]     [id number]
2  NUKUNU GERVASE          [id number]      [id number]     [id number]
Indications

First trimester aneuploidy screen (NT)         Z36
12 weeks gestation of pregnancy
Establish Gestational Age                      Z36
OB History

Gravidity:    6         Term:   3        Prem:   0        SAB:   1
TOP:          1       Ectopic:  0        Living: 3
Fetal Evaluation

Num Of Fetuses:     1
Fetal Heart         155
Rate(bpm):
Cardiac Activity:   Observed
Presentation:       Transverse, head to maternal right
Placenta:           Posterior

Amniotic Fluid
AFI FV:      Subjectively within normal limits
Biometry
CRL:      72.1  mm     G. Age:  13w 1d                  EDD:   01/01/16
Gestational Age

LMP:           12w 6d       Date:   03/29/15                 EDD:   01/03/16
Best:          12w 6d    Det. By:   LMP  (03/29/15)          EDD:   01/03/16
1st Trimester Genetic Sonogram Screening

CRL:            72.1  mm    G. Age:   13w 1d                 EDD:   01/01/16
Nuc Trans:       1.9  mm
Nasal Bone:                 Present
Anatomy

Cranium:          Appears normal         Abdominal Wall:   Appears nml (cord
insert, abd wall)
Choroid Plexus:   Appears normal         Bladder:          Appears normal
Stomach:          Appears normal, left
sided
Cervix Uterus Adnexa

Cervix
Normal appearance by transabdominal scan.

Left Ovary
Within normal limits.

Right Ovary
Within normal limits.

Adnexa:       No abnormality visualized.
Impression

SIUP at 12+6 weeks
No gross abnormalities identified
NT measurement was within normal limits for this GA; NB
present
Normal amniotic fluid volume
Measurements consistent with LMP dating
Recommendations

Offer MSAFP in the second trimester for ONTD screening
Keep scheduled appt for anatomic survey

## 2016-05-08 ENCOUNTER — Ambulatory Visit (INDEPENDENT_AMBULATORY_CARE_PROVIDER_SITE_OTHER): Payer: Medicaid Other | Admitting: *Deleted

## 2016-05-08 VITALS — BP 123/87 | HR 71 | Wt 177.1 lb

## 2016-05-08 DIAGNOSIS — Z3042 Encounter for surveillance of injectable contraceptive: Secondary | ICD-10-CM

## 2016-06-11 ENCOUNTER — Ambulatory Visit: Payer: Medicaid Other | Admitting: Obstetrics & Gynecology

## 2016-06-26 ENCOUNTER — Ambulatory Visit: Payer: Medicaid Other | Admitting: Obstetrics & Gynecology

## 2016-07-24 ENCOUNTER — Ambulatory Visit (INDEPENDENT_AMBULATORY_CARE_PROVIDER_SITE_OTHER): Payer: No Typology Code available for payment source

## 2016-07-24 VITALS — BP 117/71 | HR 75 | Ht 66.0 in | Wt 182.4 lb

## 2016-07-24 DIAGNOSIS — Z3042 Encounter for surveillance of injectable contraceptive: Secondary | ICD-10-CM

## 2016-07-24 DIAGNOSIS — Z309 Encounter for contraceptive management, unspecified: Secondary | ICD-10-CM

## 2016-10-09 ENCOUNTER — Ambulatory Visit (INDEPENDENT_AMBULATORY_CARE_PROVIDER_SITE_OTHER): Payer: No Typology Code available for payment source | Admitting: *Deleted

## 2016-10-09 VITALS — BP 120/83 | HR 61 | Wt 177.0 lb

## 2016-10-09 DIAGNOSIS — Z3042 Encounter for surveillance of injectable contraceptive: Secondary | ICD-10-CM

## 2016-10-09 NOTE — Progress Notes (Signed)
Patient presents to clinic for depo provera injection. This is her 3rd injection and she c/o continuous spotting except for the last 2 days. I advised that after this injection if still having this issue to make an appointment to be seen, there are treatments that can help. Understanding voiced.

## 2016-12-25 ENCOUNTER — Ambulatory Visit (INDEPENDENT_AMBULATORY_CARE_PROVIDER_SITE_OTHER): Payer: No Typology Code available for payment source

## 2016-12-25 VITALS — BP 110/77 | HR 85

## 2016-12-25 DIAGNOSIS — Z3042 Encounter for surveillance of injectable contraceptive: Secondary | ICD-10-CM | POA: Diagnosis not present

## 2016-12-25 DIAGNOSIS — Z30019 Encounter for initial prescription of contraceptives, unspecified: Secondary | ICD-10-CM

## 2016-12-25 NOTE — Progress Notes (Signed)
Patient presented to the office for Depo injection as scheduled. Patient tolerated well.

## 2017-03-12 ENCOUNTER — Ambulatory Visit (INDEPENDENT_AMBULATORY_CARE_PROVIDER_SITE_OTHER): Payer: No Typology Code available for payment source | Admitting: *Deleted

## 2017-03-12 VITALS — BP 122/69 | HR 79

## 2017-03-12 DIAGNOSIS — Z3042 Encounter for surveillance of injectable contraceptive: Secondary | ICD-10-CM | POA: Diagnosis not present

## 2017-03-12 NOTE — Progress Notes (Signed)
Here for depo- Instructed needs annual exam soon.

## 2017-06-01 ENCOUNTER — Ambulatory Visit: Payer: Self-pay

## 2017-08-04 ENCOUNTER — Encounter: Payer: Self-pay | Admitting: *Deleted

## 2018-06-29 ENCOUNTER — Ambulatory Visit (INDEPENDENT_AMBULATORY_CARE_PROVIDER_SITE_OTHER): Payer: Self-pay | Admitting: Obstetrics and Gynecology

## 2018-06-29 ENCOUNTER — Encounter: Payer: Self-pay | Admitting: Obstetrics and Gynecology

## 2018-06-29 VITALS — BP 107/72 | HR 87 | Wt 149.0 lb

## 2018-06-29 DIAGNOSIS — Z30017 Encounter for initial prescription of implantable subdermal contraceptive: Secondary | ICD-10-CM

## 2018-06-29 DIAGNOSIS — Z1151 Encounter for screening for human papillomavirus (HPV): Secondary | ICD-10-CM

## 2018-06-29 DIAGNOSIS — Z124 Encounter for screening for malignant neoplasm of cervix: Secondary | ICD-10-CM | POA: Insufficient documentation

## 2018-06-29 DIAGNOSIS — Z30019 Encounter for initial prescription of contraceptives, unspecified: Secondary | ICD-10-CM

## 2018-06-29 DIAGNOSIS — Z0000 Encounter for general adult medical examination without abnormal findings: Secondary | ICD-10-CM | POA: Insufficient documentation

## 2018-06-29 LAB — POCT PREGNANCY, URINE: Preg Test, Ur: NEGATIVE

## 2018-06-29 MED ORDER — ETONOGESTREL 68 MG ~~LOC~~ IMPL
68.0000 mg | DRUG_IMPLANT | Freq: Once | SUBCUTANEOUS | Status: AC
  Administered 2018-06-29: 68 mg via SUBCUTANEOUS

## 2018-06-29 NOTE — Progress Notes (Signed)
GYNECOLOGY ANNUAL PREVENTATIVE CARE ENCOUNTER NOTE  History:     Erica Bryant is a 38 y.o. [phone] female here for a routine annual gynecologic exam.  Current complaints: None.   Denies abnormal vaginal bleeding, discharge, pelvic pain, problems with intercourse or other gynecologic concerns.   Gynecologic History No LMP recorded. Patient has had an injection. Contraception: Nexplanon Last Pap: 2017. 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 Last mammogram: NA  Obstetric History OB History  Gravida Para Term Preterm AB Living  6 4 4  0 2 4  SAB TAB Ectopic Multiple Live Births  1 1 0 0 4    # Outcome Date GA Lbr Len/2nd Weight Sex Delivery Anes PTL Lv  6 Term 01/06/16 [redacted]w[redacted]d 03:14 / 00:19 7 lb 11.1 oz (3.49 kg) F Vag-Spont EPI  LIV  5 Term 11/01/11 [redacted]w[redacted]d  8 lb 7 oz (3.827 kg) F Vag-Spont EPI N LIV  4 Term 02/17/04 [redacted]w[redacted]d  7 lb 11 oz (3.487 kg) F Vag-Spont EPI N LIV  3 Term 04/26/01 [redacted]w[redacted]d  8 lb 11 oz (3.941 kg) M Vag-Spont EPI N LIV  2 TAB           1 SAB             Past Medical History:  Diagnosis Date  . Anemia     Past Surgical History:  Procedure Laterality Date  . DILATION AND CURETTAGE OF UTERUS      No current outpatient medications on file prior to visit.   Current Facility-Administered Medications on File Prior to Visit  Medication Dose Route Frequency Provider Last Rate Last Dose  . medroxyPROGESTERone (DEPO-PROVERA) injection 150 mg  150 mg Intramuscular Q90 days Lorne Skeens, MD   150 mg at 03/12/17 1053    No Known Allergies  Social History:  reports that she has never smoked. She has never used smokeless tobacco. She reports that she does not drink alcohol or use drugs.  Family History  Problem Relation Age of Onset  . Diabetes Maternal Grandmother   . Kidney disease Maternal Grandmother     The following portions of the patient's history were reviewed and updated as appropriate: allergies, current medications, past family history, past  medical history, past social history, past surgical history and problem list.  Review of Systems Pertinent items noted in HPI and remainder of comprehensive ROS otherwise negative.  Physical Exam:  BP 107/72   Pulse 87   Wt 149 lb (67.6 kg)   BMI 24.05 kg/m  CONSTITUTIONAL: Well-developed, well-nourished female in no acute distress.  HENT:  Normocephalic, atraumatic, External right and left ear normal. Oropharynx is clear and moist EYES: Conjunctivae and EOM are normal. Pupils are equal, round, and reactive to light. No scleral icterus.  NECK: Normal range of motion, supple, no masses.  Normal thyroid.  SKIN: Skin is warm and dry. No rash noted. Not diaphoretic. No erythema. No pallor. MUSCULOSKELETAL: Normal range of motion. No tenderness.  No cyanosis, clubbing, or edema.  2+ distal pulses. NEUROLOGIC: Alert and oriented to person, place, and time. Normal reflexes, muscle tone coordination. No cranial nerve deficit noted. PSYCHIATRIC: Normal mood and affect. Normal behavior. Normal judgment and thought content. CARDIOVASCULAR: Normal heart rate noted, regular rhythm RESPIRATORY: Clear to auscultation bilaterally. Effort and breath sounds normal, no problems with respiration noted. BREASTS: Symmetric in size. No masses, skin changes, nipple drainage, or lymphadenopathy. ABDOMEN: Soft, normal bowel sounds, no distention noted.  No tenderness, rebound or guarding.  PELVIC:  Normal appearing external genitalia; normal appearing vaginal mucosa and cervix.  No abnormal discharge noted.  Pap smear obtained.  Normal uterine size, no other palpable masses, no uterine or adnexal tenderness.   Assessment and Plan:   1. Encounter for female birth control  - etonogestrel (NEXPLANON) implant 68 mg  2. Cervical cancer screening  - Cytology - PAP  3. Encounter for annual physical exam   Will follow up results of pap smear and manage accordingly. Mammogram N/A Routine preventative health  maintenance measures emphasized. Please refer to After Visit Summary for other counseling recommendations.     Rasch, Harolyn Rutherford, NP [facility], [facility]         GYNECOLOGY OFFICE PROCEDURE NOTE  Erica Bryant is a 38 y.o. [phone] here for Nexplanon insertion.  Last pap smear was on 2017 and was normal.  No other gynecologic concerns.  Nexplanon Insertion Procedure Patient identified, informed consent performed, consent signed.   Patient does understand that irregular bleeding is a very common side effect of this medication. She was advised to have backup contraception for one week after placement. Pregnancy test in clinic today was negative.  Appropriate time out taken.  Patient's left arm was prepped and draped in the usual sterile fashion. The ruler used to measure and mark insertion area.  Patient was prepped with alcohol swab and then injected with 3 ml of 1% lidocaine.  She was prepped with betadine, Nexplanon removed from packaging,  Device confirmed in needle, then inserted full length of needle and withdrawn per handbook instructions. Nexplanon was able to palpated in the patient's arm; patient palpated the insert herself. There was minimal blood loss.  Patient insertion site covered with guaze and a pressure bandage to reduce any bruising.  The patient tolerated the procedure well and was given post procedure instructions.    Rasch, Harolyn Rutherford, NP [facility] [employer], [facility]

## 2018-07-08 LAB — CYTOLOGY - PAP
DIAGNOSIS: UNDETERMINED — AB
HPV 16/18/45 GENOTYPING: NEGATIVE
HPV: DETECTED — AB

## 2018-07-10 ENCOUNTER — Telehealth: Payer: Self-pay | Admitting: Obstetrics and Gynecology

## 2018-07-10 DIAGNOSIS — R87810 Cervical high risk human papillomavirus (HPV) DNA test positive: Secondary | ICD-10-CM

## 2018-07-10 MED ORDER — METRONIDAZOLE 500 MG PO TABS: 1000.0000 mg | ORAL_TABLET | Freq: Once | ORAL | 0 refills | Status: AC

## 2018-07-10 NOTE — Telephone Encounter (Signed)
+   abnormal pap and + trichomonas. The patient was called today and noticed of her results. She will need a colpo. A message was sent to the Admin pool at [facility] to schedule the patient. RX: Flagyl sent 1000 mg X 1 dose, no intercourse x 7 day and no alcohol with flagyl. All questions answered.   Venia Carbon I, NP 07/10/2018 9:07 AM

## 2018-07-28 ENCOUNTER — Encounter: Payer: Self-pay | Admitting: *Deleted

## 2018-09-21 ENCOUNTER — Encounter: Payer: Self-pay | Admitting: Obstetrics & Gynecology

## 2018-09-21 ENCOUNTER — Telehealth: Payer: Self-pay | Admitting: Obstetrics and Gynecology

## 2018-09-21 NOTE — Telephone Encounter (Signed)
Called to speak to the patient about getting scheduled. A man answered and stated wrong number.
